# Patient Record
Sex: Female | Born: 1955 | Race: Black or African American | Hispanic: No | Marital: Single
Health system: Southern US, Community
[De-identification: ages and names within clinical notes are randomized; demographics above are authoritative.]

---

## 2003-10-09 ENCOUNTER — Other Ambulatory Visit: Payer: Self-pay

## 2004-07-15 ENCOUNTER — Emergency Department: Payer: Self-pay | Admitting: Emergency Medicine

## 2004-11-15 ENCOUNTER — Emergency Department: Payer: Self-pay | Admitting: Emergency Medicine

## 2005-02-18 ENCOUNTER — Inpatient Hospital Stay: Payer: Self-pay | Admitting: Internal Medicine

## 2005-02-18 ENCOUNTER — Other Ambulatory Visit: Payer: Self-pay

## 2005-03-19 ENCOUNTER — Emergency Department: Payer: Self-pay | Admitting: Internal Medicine

## 2005-03-27 ENCOUNTER — Other Ambulatory Visit: Payer: Self-pay

## 2005-03-27 ENCOUNTER — Emergency Department: Payer: Self-pay | Admitting: Emergency Medicine

## 2005-04-03 ENCOUNTER — Emergency Department: Payer: Self-pay | Admitting: Emergency Medicine

## 2005-04-10 ENCOUNTER — Emergency Department: Payer: Self-pay | Admitting: Emergency Medicine

## 2005-07-28 ENCOUNTER — Emergency Department: Payer: Self-pay | Admitting: Emergency Medicine

## 2005-08-22 ENCOUNTER — Emergency Department: Payer: Self-pay | Admitting: Unknown Physician Specialty

## 2005-08-22 ENCOUNTER — Other Ambulatory Visit: Payer: Self-pay

## 2007-04-04 ENCOUNTER — Emergency Department: Payer: Self-pay | Admitting: Emergency Medicine

## 2007-04-08 ENCOUNTER — Emergency Department: Payer: Self-pay | Admitting: Unknown Physician Specialty

## 2007-05-07 ENCOUNTER — Inpatient Hospital Stay: Payer: Self-pay | Admitting: Internal Medicine

## 2007-05-19 ENCOUNTER — Emergency Department: Payer: Self-pay | Admitting: Internal Medicine

## 2007-06-20 IMAGING — CR DG CHEST 2V
1 series · 2 of 2 positions shown · non-contrast
Comparison: none

REASON FOR EXAM: Shortness of breath, wheezing
COMMENTS:

[Series 1: view not recorded · 0.17mm/px · 2 of 2 slices shown]
[im 1/2]
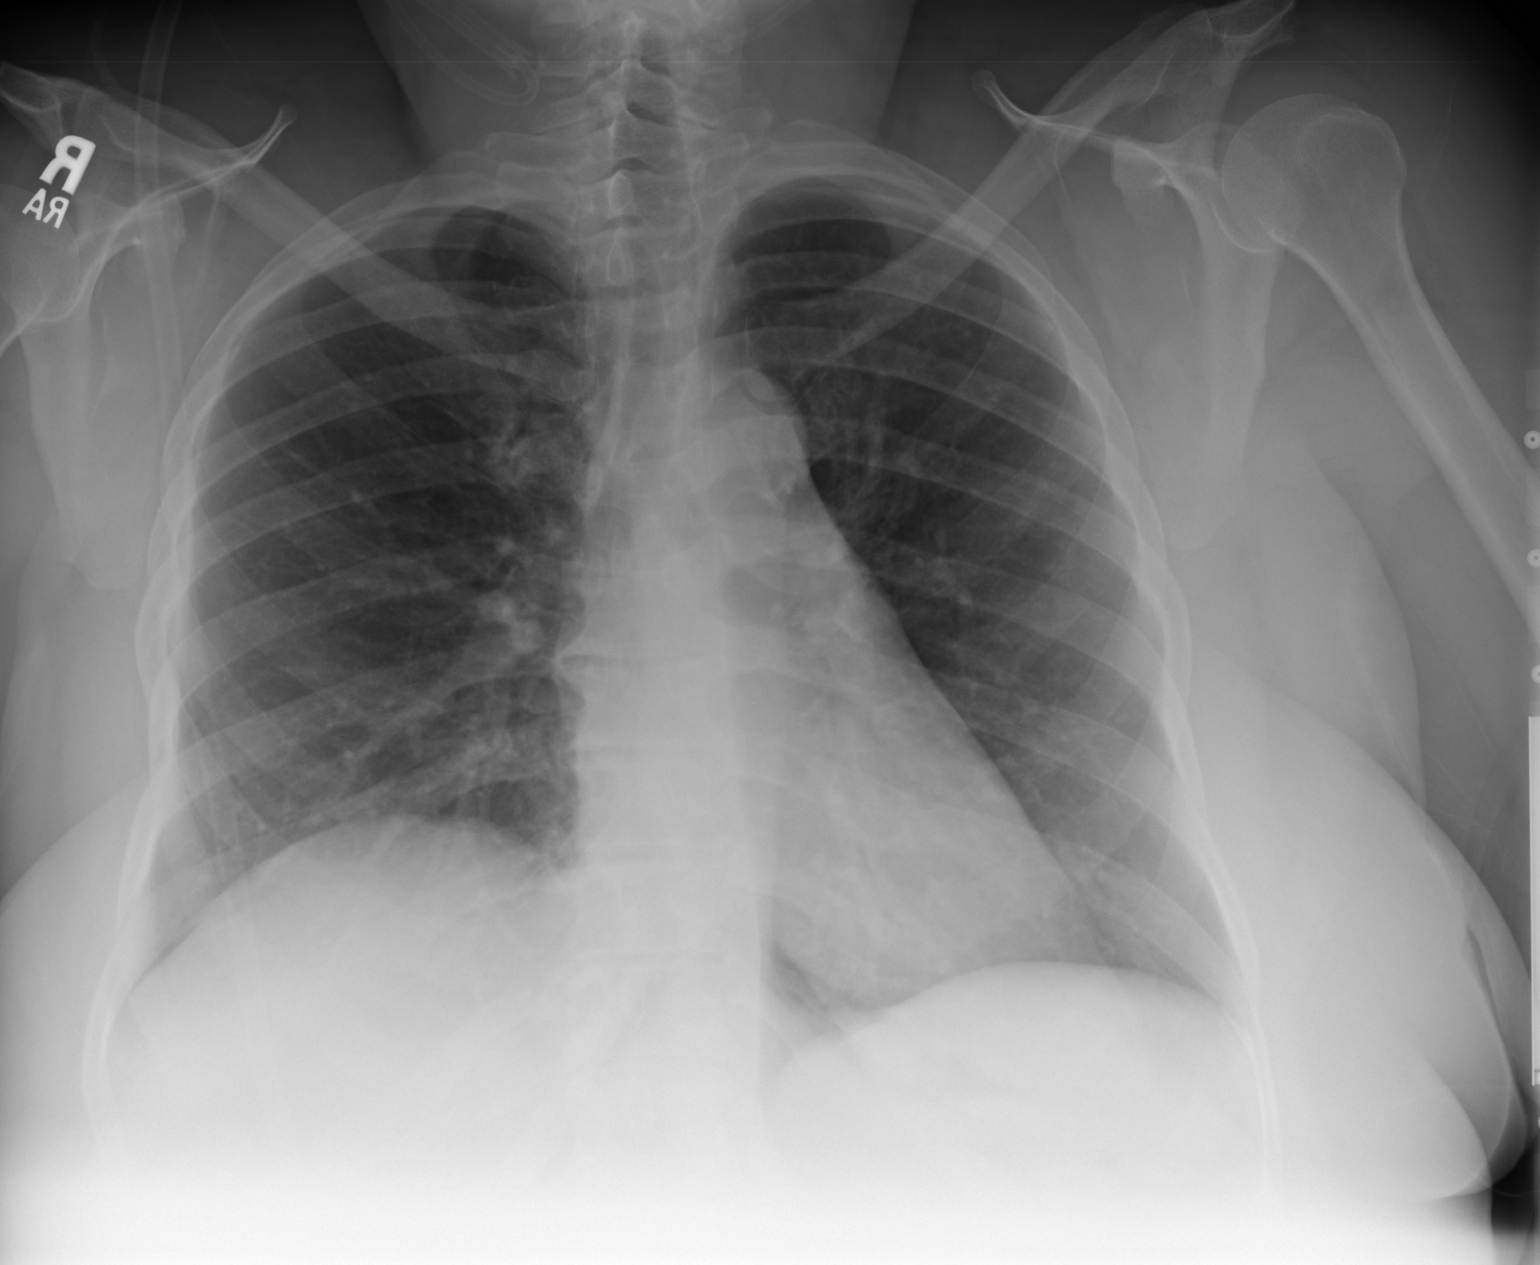
[im 2/2]
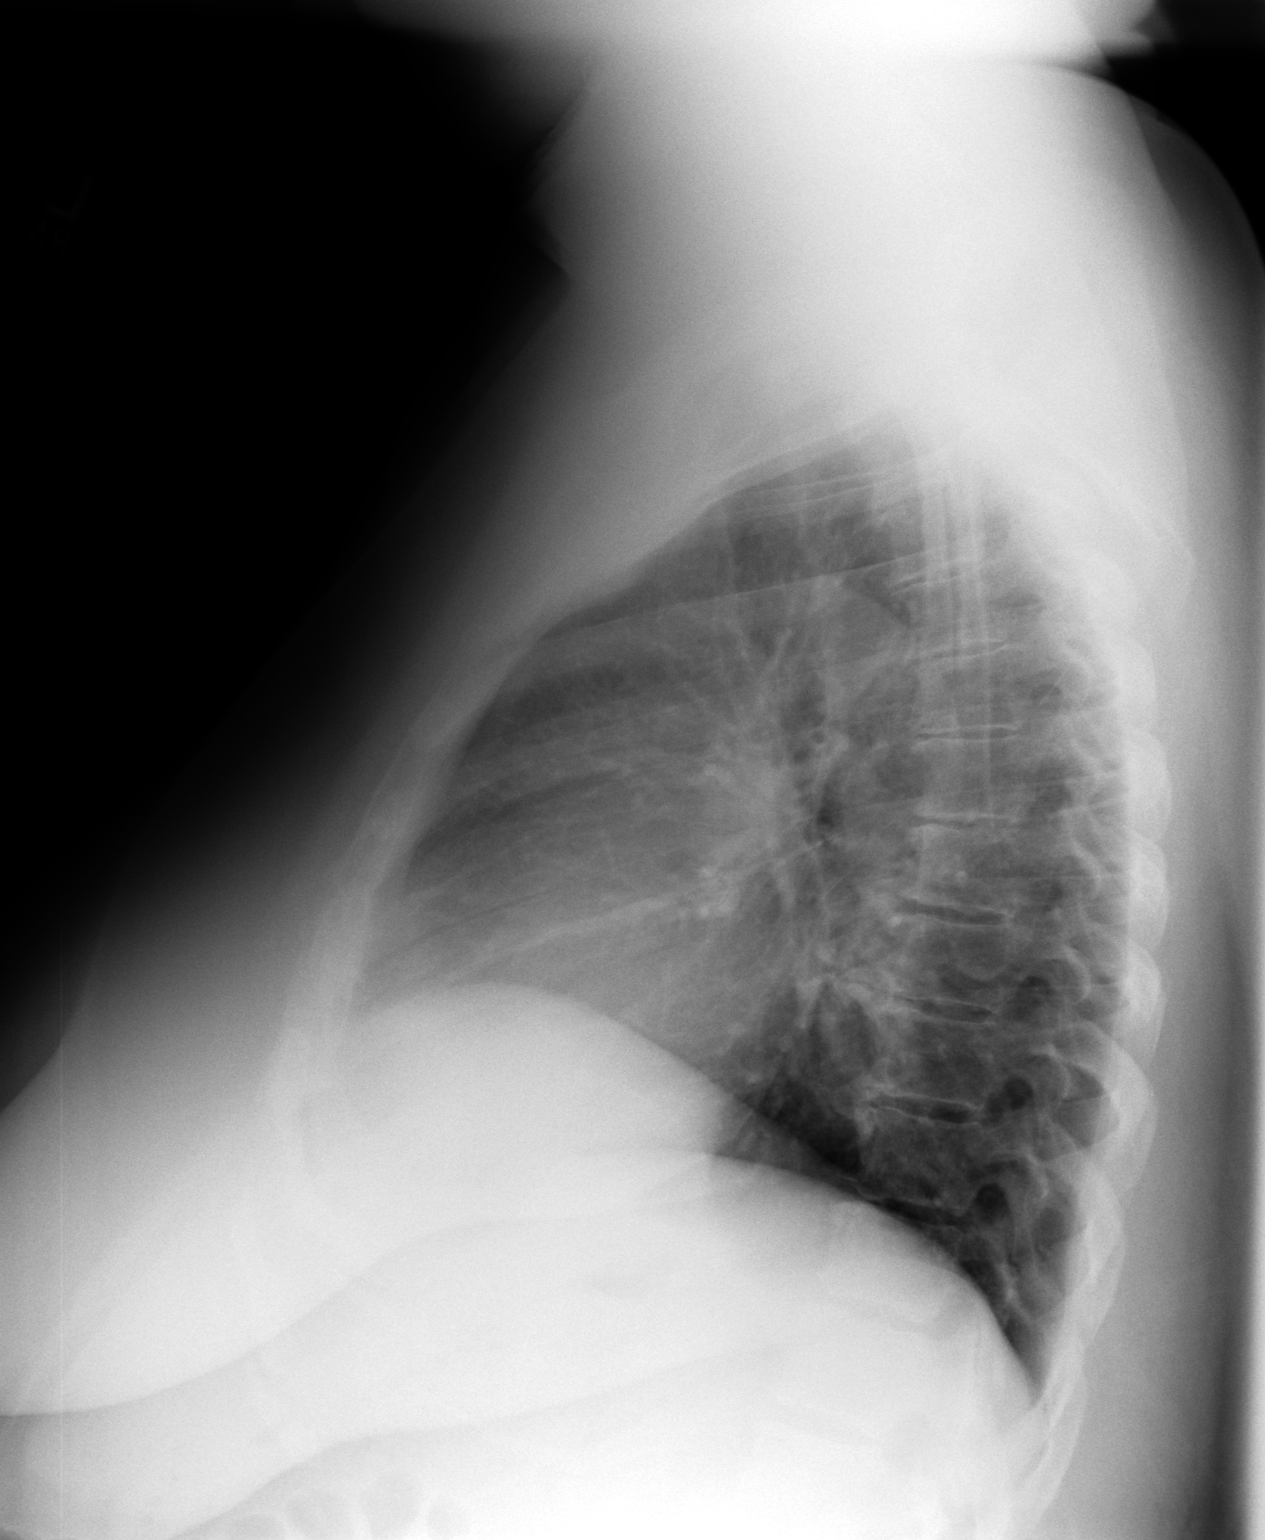

[2 of 2 positions shown; findings below may reference images not displayed]

PROCEDURE:     DXR - DXR CHEST PA (OR AP) AND LATERAL  - February 18, 2005  [DATE]

RESULT:     The lungs are adequately inflated. There is no focal infiltrate.
The heart and pulmonary vascularity are normal in appearance. There is no
pleural effusion. Mild degenerative change of the thoracic spine is seen.
IMPRESSION: I do not see evidence of an acute abnormality within the
chest. Specifically, I do not see findings to suggest pneumonia. Given that
there is clinical history of wheezing, reactive airway disease may be
present.

## 2010-02-14 ENCOUNTER — Observation Stay: Payer: Self-pay | Admitting: Emergency Medicine

## 2010-12-12 ENCOUNTER — Emergency Department: Payer: Self-pay | Admitting: Emergency Medicine

## 2011-03-08 ENCOUNTER — Emergency Department: Payer: Self-pay | Admitting: Emergency Medicine

## 2011-03-10 ENCOUNTER — Emergency Department: Payer: Self-pay | Admitting: Emergency Medicine

## 2011-04-02 ENCOUNTER — Emergency Department: Payer: Self-pay | Admitting: Emergency Medicine

## 2011-09-08 ENCOUNTER — Inpatient Hospital Stay: Payer: Self-pay | Admitting: Psychiatry

## 2011-09-08 LAB — ETHANOL
Ethanol %: <0.003 % (ref 0.000–0.080)
Ethanol: <3 mg/dL

## 2011-09-08 LAB — COMPREHENSIVE METABOLIC PANEL
Albumin: 4 g/dL (ref 3.4–5.0)
Alkaline Phosphatase: 87 U/L (ref 50–136)
Anion Gap: 9 (ref 7–16)
BUN: 21 mg/dL — ABNORMAL HIGH (ref 7–18)
Bilirubin,Total: 0.2 mg/dL (ref 0.2–1.0)
Calcium, Total: 8.9 mg/dL (ref 8.5–10.1)
Chloride: 106 mmol/L (ref 98–107)
Co2: 25 mmol/L (ref 21–32)
Creatinine: 1.35 mg/dL — ABNORMAL HIGH (ref 0.60–1.30)
EGFR (African American): 50 — ABNORMAL LOW
EGFR (Non-African Amer.): 43 — ABNORMAL LOW
Glucose: 83 mg/dL (ref 65–99)
Potassium: 4.3 mmol/L (ref 3.5–5.1)
SGOT(AST): 23 U/L (ref 15–37)

## 2011-09-08 LAB — TSH: Thyroid Stimulating Horm: 0.86 u[IU]/mL

## 2011-09-08 LAB — DRUG SCREEN, URINE
Benzodiazepine, Ur Scrn: NEGATIVE (ref ?–200)
MDMA (Ecstasy)Ur Screen: NEGATIVE (ref ?–500)
Methadone, Ur Screen: NEGATIVE (ref ?–300)
Phencyclidine (PCP) Ur S: NEGATIVE (ref ?–25)
Tricyclic, Ur Screen: NEGATIVE (ref ?–1000)

## 2011-09-08 LAB — CBC
HCT: 30.1 % — ABNORMAL LOW (ref 35.0–47.0)
HGB: 10 g/dL — ABNORMAL LOW (ref 12.0–16.0)
MCV: 95 fL (ref 80–100)
RBC: 3.18 10*6/uL — ABNORMAL LOW (ref 3.80–5.20)
RDW: 14.6 % — ABNORMAL HIGH (ref 11.5–14.5)

## 2011-09-08 LAB — URINALYSIS, COMPLETE
Bilirubin,UR: NEGATIVE
Ketone: NEGATIVE
Nitrite: NEGATIVE
RBC,UR: 2 /HPF (ref 0–5)

## 2011-09-08 LAB — LIPASE, BLOOD: Lipase: 371 U/L (ref 73–393)

## 2011-09-09 LAB — BEHAVIORAL MEDICINE 1 PANEL
Albumin: 3.6 g/dL (ref 3.4–5.0)
Alkaline Phosphatase: 77 U/L (ref 50–136)
BUN: 19 mg/dL — ABNORMAL HIGH (ref 7–18)
Basophil #: 0 10*3/uL (ref 0.0–0.1)
Bilirubin,Total: 0.1 mg/dL — ABNORMAL LOW (ref 0.2–1.0)
Calcium, Total: 8.5 mg/dL (ref 8.5–10.1)
Co2: 25 mmol/L (ref 21–32)
EGFR (African American): >60
Eosinophil #: 0.3 10*3/uL (ref 0.0–0.7)
Eosinophil %: 5.5 %
HGB: 9.5 g/dL — ABNORMAL LOW (ref 12.0–16.0)
Lymphocyte %: 37.9 %
MCH: 30.4 pg (ref 26.0–34.0)
MCHC: 32.5 g/dL (ref 32.0–36.0)
MCV: 94 fL (ref 80–100)
Monocyte %: 7.2 %
Neutrophil %: 48.8 %
Osmolality: 279 (ref 275–301)
Platelet: 285 10*3/uL (ref 150–440)
Potassium: 4.4 mmol/L (ref 3.5–5.1)
RDW: 14.5 % (ref 11.5–14.5)
SGOT(AST): 18 U/L (ref 15–37)
Sodium: 139 mmol/L (ref 136–145)
Thyroid Stimulating Horm: 0.823 u[IU]/mL
WBC: 6.2 10*3/uL (ref 3.6–11.0)

## 2011-09-09 LAB — BASIC METABOLIC PANEL
Anion Gap: 8 (ref 7–16)
BUN: 21 mg/dL — ABNORMAL HIGH (ref 7–18)
Calcium, Total: 8.7 mg/dL (ref 8.5–10.1)
Chloride: 105 mmol/L (ref 98–107)
Co2: 27 mmol/L (ref 21–32)
EGFR (African American): 59 — ABNORMAL LOW
EGFR (Non-African Amer.): 51 — ABNORMAL LOW
Glucose: 72 mg/dL (ref 65–99)
Osmolality: 281 (ref 275–301)
Sodium: 140 mmol/L (ref 136–145)

## 2011-09-09 LAB — LIPASE, BLOOD: Lipase: 497 U/L — ABNORMAL HIGH (ref 73–393)

## 2011-09-10 LAB — CBC WITH DIFFERENTIAL/PLATELET
Basophil %: 0.7 %
Eosinophil #: 0.3 10*3/uL (ref 0.0–0.7)
HCT: 28.8 % — ABNORMAL LOW (ref 35.0–47.0)
HGB: 9.5 g/dL — ABNORMAL LOW (ref 12.0–16.0)
Lymphocyte #: 1.8 10*3/uL (ref 1.0–3.6)
Lymphocyte %: 35.6 %
MCHC: 33.2 g/dL (ref 32.0–36.0)
MCV: 94 fL (ref 80–100)
Monocyte %: 6.9 %
Neutrophil #: 2.6 10*3/uL (ref 1.4–6.5)
Neutrophil %: 51.6 %
RDW: 14.4 % (ref 11.5–14.5)

## 2011-09-10 LAB — IRON AND TIBC: Unbound Iron-Bind.Cap.: 260 ug/dL

## 2011-11-11 ENCOUNTER — Emergency Department: Payer: Self-pay | Admitting: Emergency Medicine

## 2011-11-11 LAB — COMPREHENSIVE METABOLIC PANEL
Alkaline Phosphatase: 105 U/L (ref 50–136)
BUN: 26 mg/dL — ABNORMAL HIGH (ref 7–18)
Bilirubin,Total: 0.2 mg/dL (ref 0.2–1.0)
Chloride: 105 mmol/L (ref 98–107)
EGFR (African American): 47 — ABNORMAL LOW
EGFR (Non-African Amer.): 41 — ABNORMAL LOW
Glucose: 90 mg/dL (ref 65–99)
SGOT(AST): 18 U/L (ref 15–37)
SGPT (ALT): 17 U/L
Sodium: 137 mmol/L (ref 136–145)
Total Protein: 8.4 g/dL — ABNORMAL HIGH (ref 6.4–8.2)

## 2011-11-11 LAB — URINALYSIS, COMPLETE
Glucose,UR: NEGATIVE mg/dL (ref 0–75)
Hyaline Cast: 6
Ketone: NEGATIVE
Nitrite: NEGATIVE
Ph: 5 (ref 4.5–8.0)
Squamous Epithelial: 3

## 2011-11-11 LAB — CBC
HCT: 32.8 % — ABNORMAL LOW (ref 35.0–47.0)
HGB: 10.9 g/dL — ABNORMAL LOW (ref 12.0–16.0)
MCHC: 33.1 g/dL (ref 32.0–36.0)
MCV: 93 fL (ref 80–100)
Platelet: 258 10*3/uL (ref 150–440)
RBC: 3.53 10*6/uL — ABNORMAL LOW (ref 3.80–5.20)
RDW: 15.1 % — ABNORMAL HIGH (ref 11.5–14.5)
WBC: 8.2 10*3/uL (ref 3.6–11.0)

## 2011-12-01 ENCOUNTER — Inpatient Hospital Stay: Payer: Self-pay | Admitting: Internal Medicine

## 2011-12-01 LAB — CBC
HCT: 32.8 % — ABNORMAL LOW (ref 35.0–47.0)
HGB: 10.9 g/dL — ABNORMAL LOW (ref 12.0–16.0)
MCH: 30.9 pg (ref 26.0–34.0)
RBC: 3.53 10*6/uL — ABNORMAL LOW (ref 3.80–5.20)
RDW: 14.6 % — ABNORMAL HIGH (ref 11.5–14.5)
WBC: 6.6 10*3/uL (ref 3.6–11.0)

## 2011-12-01 LAB — COMPREHENSIVE METABOLIC PANEL
Anion Gap: 4 — ABNORMAL LOW (ref 7–16)
BUN: 14 mg/dL (ref 7–18)
Bilirubin,Total: 0.2 mg/dL (ref 0.2–1.0)
Chloride: 104 mmol/L (ref 98–107)
Co2: 31 mmol/L (ref 21–32)
Creatinine: 1.05 mg/dL (ref 0.60–1.30)
EGFR (African American): >60
Potassium: 4.3 mmol/L (ref 3.5–5.1)
SGOT(AST): 23 U/L (ref 15–37)
Sodium: 139 mmol/L (ref 136–145)
Total Protein: 8.2 g/dL (ref 6.4–8.2)

## 2011-12-01 LAB — URINALYSIS, COMPLETE
Bacteria: NONE SEEN
Bilirubin,UR: NEGATIVE
Blood: NEGATIVE
Glucose,UR: NEGATIVE mg/dL (ref 0–75)
Ketone: NEGATIVE
Leukocyte Esterase: NEGATIVE
Nitrite: NEGATIVE
Ph: 7 (ref 4.5–8.0)
Squamous Epithelial: 2

## 2011-12-01 LAB — CBC WITH DIFFERENTIAL/PLATELET
Basophil #: 0 10*3/uL (ref 0.0–0.1)
Basophil %: 0.5 %
Eosinophil #: 0.3 10*3/uL (ref 0.0–0.7)
Eosinophil %: 4.1 %
HGB: 10.9 g/dL — ABNORMAL LOW (ref 12.0–16.0)
Lymphocyte %: 32.4 %
MCV: 93 fL (ref 80–100)
Monocyte %: 6.6 %
Neutrophil %: 56.4 %
Platelet: 286 10*3/uL (ref 150–440)
RBC: 3.49 10*6/uL — ABNORMAL LOW (ref 3.80–5.20)
RDW: 14.5 % (ref 11.5–14.5)
WBC: 6.7 10*3/uL (ref 3.6–11.0)

## 2011-12-01 LAB — CK TOTAL AND CKMB (NOT AT ARMC)
CK, Total: 94 U/L (ref 21–215)
CK-MB: <0.5 ng/mL — ABNORMAL LOW (ref 0.5–3.6)

## 2011-12-01 LAB — APTT: Activated PTT: 36 secs — ABNORMAL HIGH (ref 23.6–35.9)

## 2011-12-02 LAB — CBC WITH DIFFERENTIAL/PLATELET
Basophil #: 0 10*3/uL (ref 0.0–0.1)
Basophil %: 0.4 %
Eosinophil #: 0.3 10*3/uL (ref 0.0–0.7)
Eosinophil %: 4.6 %
HCT: 30.5 % — ABNORMAL LOW (ref 35.0–47.0)
HGB: 10.4 g/dL — ABNORMAL LOW (ref 12.0–16.0)
Lymphocyte #: 2.5 10*3/uL (ref 1.0–3.6)
MCH: 31.3 pg (ref 26.0–34.0)
MCHC: 34 g/dL (ref 32.0–36.0)
MCV: 92 fL (ref 80–100)
Monocyte #: 0.4 x10 3/mm (ref 0.2–0.9)
Neutrophil #: 3.4 10*3/uL (ref 1.4–6.5)
Neutrophil %: 51.2 %
RBC: 3.31 10*6/uL — ABNORMAL LOW (ref 3.80–5.20)
RDW: 14.6 % — ABNORMAL HIGH (ref 11.5–14.5)

## 2011-12-02 LAB — TROPONIN I: Troponin-I: <0.02 ng/mL

## 2011-12-02 LAB — COMPREHENSIVE METABOLIC PANEL
Albumin: 3.6 g/dL (ref 3.4–5.0)
Alkaline Phosphatase: 111 U/L (ref 50–136)
Bilirubin,Total: 0.2 mg/dL (ref 0.2–1.0)
Co2: 29 mmol/L (ref 21–32)
Creatinine: 0.99 mg/dL (ref 0.60–1.30)
Glucose: 97 mg/dL (ref 65–99)
Osmolality: 280 (ref 275–301)
SGPT (ALT): 16 U/L
Sodium: 140 mmol/L (ref 136–145)
Total Protein: 7.3 g/dL (ref 6.4–8.2)

## 2011-12-02 LAB — HCG, QUANTITATIVE, PREGNANCY: Beta Hcg, Quant.: <1 m[IU]/mL — ABNORMAL LOW

## 2011-12-03 LAB — CK TOTAL AND CKMB (NOT AT ARMC)
CK, Total: 79 U/L (ref 21–215)
CK-MB: <0.5 ng/mL — ABNORMAL LOW (ref 0.5–3.6)

## 2012-01-12 ENCOUNTER — Ambulatory Visit: Payer: Self-pay

## 2012-02-16 ENCOUNTER — Ambulatory Visit: Payer: Self-pay | Admitting: Radiation Oncology

## 2012-02-22 LAB — CREATININE, SERUM
Creatinine: 1.33 mg/dL — ABNORMAL HIGH (ref 0.60–1.30)
EGFR (Non-African Amer.): 44 — ABNORMAL LOW

## 2012-02-23 ENCOUNTER — Ambulatory Visit: Payer: Self-pay | Admitting: Radiation Oncology

## 2012-02-24 ENCOUNTER — Ambulatory Visit: Payer: Self-pay | Admitting: Surgery

## 2012-02-24 LAB — DRUG SCREEN, URINE
Amphetamines, Ur Screen: NEGATIVE (ref ?–1000)
Benzodiazepine, Ur Scrn: POSITIVE (ref ?–200)
Cannabinoid 50 Ng, Ur ~~LOC~~: NEGATIVE (ref ?–50)
Cocaine Metabolite,Ur ~~LOC~~: POSITIVE (ref ?–300)
MDMA (Ecstasy)Ur Screen: POSITIVE (ref ?–500)
Methadone, Ur Screen: NEGATIVE (ref ?–300)
Phencyclidine (PCP) Ur S: NEGATIVE (ref ?–25)

## 2012-02-25 LAB — COMPREHENSIVE METABOLIC PANEL
Alkaline Phosphatase: 74 U/L (ref 50–136)
BUN: 25 mg/dL — ABNORMAL HIGH (ref 7–18)
Bilirubin,Total: 0.1 mg/dL — ABNORMAL LOW (ref 0.2–1.0)
Calcium, Total: 5.9 mg/dL — CL (ref 8.5–10.1)
Chloride: 114 mmol/L — ABNORMAL HIGH (ref 98–107)
Co2: 16 mmol/L — ABNORMAL LOW (ref 21–32)
Creatinine: 0.85 mg/dL (ref 0.60–1.30)
EGFR (African American): >60
EGFR (Non-African Amer.): >60
SGPT (ALT): 39 U/L (ref 12–78)
Total Protein: 4.6 g/dL — ABNORMAL LOW (ref 6.4–8.2)

## 2012-02-25 LAB — CBC CANCER CENTER
Basophil #: 0 x10 3/mm (ref 0.0–0.1)
Eosinophil %: 0 %
Lymphocyte #: 1.7 x10 3/mm (ref 1.0–3.6)
Lymphocyte %: 17.5 %
MCH: 30.4 pg (ref 26.0–34.0)
MCV: 91 fL (ref 80–100)
Monocyte #: 0.6 x10 3/mm (ref 0.2–0.9)
Monocyte %: 6.5 %
Neutrophil %: 75.8 %
Platelet: 265 x10 3/mm (ref 150–440)
RBC: 2.72 10*6/uL — ABNORMAL LOW (ref 3.80–5.20)
RDW: 13.1 % (ref 11.5–14.5)
WBC: 9.7 x10 3/mm (ref 3.6–11.0)

## 2012-02-29 ENCOUNTER — Emergency Department: Payer: Self-pay | Admitting: Emergency Medicine

## 2012-02-29 LAB — CBC WITH DIFFERENTIAL/PLATELET
Basophil #: 0 10*3/uL (ref 0.0–0.1)
Basophil %: 0.1 %
Eosinophil #: 0.1 10*3/uL (ref 0.0–0.7)
Eosinophil %: 0.1 %
HGB: 9.6 g/dL — ABNORMAL LOW (ref 12.0–16.0)
Lymphocyte %: 3.2 %
MCH: 28.6 pg (ref 26.0–34.0)
MCHC: 32.2 g/dL (ref 32.0–36.0)
Monocyte %: 0.7 %
Neutrophil %: 95.9 %
Platelet: 284 10*3/uL (ref 150–440)
RDW: 13.3 % (ref 11.5–14.5)
WBC: 28.7 10*3/uL — ABNORMAL HIGH (ref 3.6–11.0)

## 2012-02-29 LAB — COMPREHENSIVE METABOLIC PANEL
Alkaline Phosphatase: 199 U/L — ABNORMAL HIGH (ref 50–136)
Anion Gap: 10 (ref 7–16)
BUN: 14 mg/dL (ref 7–18)
Bilirubin,Total: 0.4 mg/dL (ref 0.2–1.0)
Chloride: 104 mmol/L (ref 98–107)
Creatinine: 0.98 mg/dL (ref 0.60–1.30)
EGFR (African American): >60
EGFR (Non-African Amer.): >60
Osmolality: 274 (ref 275–301)
SGOT(AST): 42 U/L — ABNORMAL HIGH (ref 15–37)
SGPT (ALT): 55 U/L (ref 12–78)
Sodium: 137 mmol/L (ref 136–145)
Total Protein: 7.2 g/dL (ref 6.4–8.2)

## 2012-02-29 LAB — TROPONIN I: Troponin-I: <0.02 ng/mL

## 2012-03-03 LAB — CBC CANCER CENTER
Basophil %: 0.6 %
Eosinophil %: 3.6 %
Lymphocyte %: 17.9 %
MCH: 29.9 pg (ref 26.0–34.0)
MCHC: 32.6 g/dL (ref 32.0–36.0)
MCV: 92 fL (ref 80–100)
Neutrophil #: 1.8 x10 3/mm (ref 1.4–6.5)
Platelet: 190 x10 3/mm (ref 150–440)
RBC: 3 10*6/uL — ABNORMAL LOW (ref 3.80–5.20)

## 2012-03-06 LAB — CULTURE, BLOOD (SINGLE)

## 2012-03-10 LAB — CBC CANCER CENTER
Basophil #: 0 x10 3/mm (ref 0.0–0.1)
Eosinophil #: 0.1 x10 3/mm (ref 0.0–0.7)
HCT: 27 % — ABNORMAL LOW (ref 35.0–47.0)
Lymphocyte %: 16 %
MCHC: 32.4 g/dL (ref 32.0–36.0)
Neutrophil #: 6.6 x10 3/mm — ABNORMAL HIGH (ref 1.4–6.5)
RBC: 2.94 10*6/uL — ABNORMAL LOW (ref 3.80–5.20)
RDW: 13.1 % (ref 11.5–14.5)
WBC: 8.3 x10 3/mm (ref 3.6–11.0)

## 2012-03-10 LAB — COMPREHENSIVE METABOLIC PANEL
Anion Gap: 13 (ref 7–16)
BUN: 20 mg/dL — ABNORMAL HIGH (ref 7–18)
Calcium, Total: 9 mg/dL (ref 8.5–10.1)
Co2: 25 mmol/L (ref 21–32)
EGFR (African American): >60
EGFR (Non-African Amer.): 53 — ABNORMAL LOW
Glucose: 109 mg/dL — ABNORMAL HIGH (ref 65–99)
Osmolality: 286 (ref 275–301)
Potassium: 4 mmol/L (ref 3.5–5.1)
Sodium: 142 mmol/L (ref 136–145)
Total Protein: 6.9 g/dL (ref 6.4–8.2)

## 2012-03-24 LAB — COMPREHENSIVE METABOLIC PANEL
Alkaline Phosphatase: 107 U/L (ref 50–136)
Anion Gap: 11 (ref 7–16)
Bilirubin,Total: 0.1 mg/dL — ABNORMAL LOW (ref 0.2–1.0)
Calcium, Total: 8.9 mg/dL (ref 8.5–10.1)
Chloride: 101 mmol/L (ref 98–107)
Co2: 27 mmol/L (ref 21–32)
Creatinine: 1.24 mg/dL (ref 0.60–1.30)
EGFR (African American): 56 — ABNORMAL LOW
EGFR (Non-African Amer.): 48 — ABNORMAL LOW
Osmolality: 281 (ref 275–301)
SGOT(AST): 15 U/L (ref 15–37)
Sodium: 139 mmol/L (ref 136–145)

## 2012-03-24 LAB — CBC CANCER CENTER
Basophil #: 0.1 x10 3/mm (ref 0.0–0.1)
Eosinophil %: 0.7 %
Lymphocyte #: 1.1 x10 3/mm (ref 1.0–3.6)
Lymphocyte %: 8 %
MCH: 29.9 pg (ref 26.0–34.0)
MCV: 91 fL (ref 80–100)
Monocyte #: 0.7 x10 3/mm (ref 0.2–0.9)
Monocyte %: 5.3 %
Neutrophil %: 85.3 %
Platelet: 188 x10 3/mm (ref 150–440)
RDW: 13.5 % (ref 11.5–14.5)
WBC: 13.5 x10 3/mm — ABNORMAL HIGH (ref 3.6–11.0)

## 2012-03-25 ENCOUNTER — Ambulatory Visit: Payer: Self-pay | Admitting: Radiation Oncology

## 2012-03-31 LAB — CBC CANCER CENTER
Eosinophil %: 2.6 %
HCT: 23.4 % — ABNORMAL LOW (ref 35.0–47.0)
Lymphocyte #: 0.5 x10 3/mm — ABNORMAL LOW (ref 1.0–3.6)
Lymphocyte %: 16.9 %
MCV: 90 fL (ref 80–100)
Monocyte #: 0.1 x10 3/mm — ABNORMAL LOW (ref 0.2–0.9)
Monocyte %: 4 %
Neutrophil #: 2.1 x10 3/mm (ref 1.4–6.5)
Platelet: 203 x10 3/mm (ref 150–440)
RBC: 2.6 10*6/uL — ABNORMAL LOW (ref 3.80–5.20)
WBC: 2.8 x10 3/mm — ABNORMAL LOW (ref 3.6–11.0)

## 2012-04-07 LAB — CBC CANCER CENTER
Basophil #: 0 x10 3/mm (ref 0.0–0.1)
Basophil %: 0.5 %
Eosinophil #: 0.1 x10 3/mm (ref 0.0–0.7)
HCT: 25 % — ABNORMAL LOW (ref 35.0–47.0)
HGB: 8.2 g/dL — ABNORMAL LOW (ref 12.0–16.0)
Lymphocyte %: 8.2 %
MCH: 30.3 pg (ref 26.0–34.0)
MCHC: 32.8 g/dL (ref 32.0–36.0)
Monocyte #: 0.5 x10 3/mm (ref 0.2–0.9)
Neutrophil %: 85.1 %
Platelet: 341 x10 3/mm (ref 150–440)
RBC: 2.71 10*6/uL — ABNORMAL LOW (ref 3.80–5.20)

## 2012-04-07 LAB — COMPREHENSIVE METABOLIC PANEL
Albumin: 3.6 g/dL (ref 3.4–5.0)
Anion Gap: 14 (ref 7–16)
BUN: 26 mg/dL — ABNORMAL HIGH (ref 7–18)
Calcium, Total: 8.7 mg/dL (ref 8.5–10.1)
Chloride: 109 mmol/L — ABNORMAL HIGH (ref 98–107)
EGFR (African American): 41 — ABNORMAL LOW
Glucose: 105 mg/dL — ABNORMAL HIGH (ref 65–99)
Osmolality: 283 (ref 275–301)
Potassium: 4.4 mmol/L (ref 3.5–5.1)
Sodium: 139 mmol/L (ref 136–145)
Total Protein: 6.9 g/dL (ref 6.4–8.2)

## 2012-04-24 ENCOUNTER — Ambulatory Visit: Payer: Self-pay | Admitting: Radiation Oncology

## 2012-04-28 LAB — COMPREHENSIVE METABOLIC PANEL
Albumin: 3.2 g/dL — ABNORMAL LOW (ref 3.4–5.0)
Anion Gap: 12 (ref 7–16)
BUN: 15 mg/dL (ref 7–18)
Bilirubin,Total: <0.1 mg/dL — ABNORMAL LOW (ref 0.2–1.0)
Calcium, Total: 8.6 mg/dL (ref 8.5–10.1)
Creatinine: 0.97 mg/dL (ref 0.60–1.30)
EGFR (African American): >60
EGFR (Non-African Amer.): >60
Glucose: 116 mg/dL — ABNORMAL HIGH (ref 65–99)
Osmolality: 281 (ref 275–301)
Potassium: 3.5 mmol/L (ref 3.5–5.1)
SGPT (ALT): 17 U/L (ref 12–78)
Sodium: 140 mmol/L (ref 136–145)
Total Protein: 6.3 g/dL — ABNORMAL LOW (ref 6.4–8.2)

## 2012-04-28 LAB — CBC CANCER CENTER
Basophil #: 0 x10 3/mm (ref 0.0–0.1)
Basophil %: 0.6 %
Eosinophil #: 0.1 x10 3/mm (ref 0.0–0.7)
HCT: 20.3 % — ABNORMAL LOW (ref 35.0–47.0)
HGB: 7 g/dL — ABNORMAL LOW (ref 12.0–16.0)
Lymphocyte #: 0.5 x10 3/mm — ABNORMAL LOW (ref 1.0–3.6)
MCHC: 34.4 g/dL (ref 32.0–36.0)
MCV: 90 fL (ref 80–100)
Monocyte %: 7.8 %
Platelet: 336 x10 3/mm (ref 150–440)
RDW: 18.3 % — ABNORMAL HIGH (ref 11.5–14.5)
WBC: 5.4 x10 3/mm (ref 3.6–11.0)

## 2012-05-05 LAB — CBC CANCER CENTER
Basophil #: 0.1 x10 3/mm (ref 0.0–0.1)
Eosinophil #: 0.2 x10 3/mm (ref 0.0–0.7)
Eosinophil %: 5.1 %
HGB: 8.6 g/dL — ABNORMAL LOW (ref 12.0–16.0)
Lymphocyte #: 0.5 x10 3/mm — ABNORMAL LOW (ref 1.0–3.6)
MCH: 31.8 pg (ref 26.0–34.0)
MCHC: 34.8 g/dL (ref 32.0–36.0)
MCV: 91 fL (ref 80–100)
Monocyte #: 0.3 x10 3/mm (ref 0.2–0.9)
Neutrophil #: 3.1 x10 3/mm (ref 1.4–6.5)
Platelet: 225 x10 3/mm (ref 150–440)
RBC: 2.7 10*6/uL — ABNORMAL LOW (ref 3.80–5.20)
RDW: 18.5 % — ABNORMAL HIGH (ref 11.5–14.5)
WBC: 4.1 x10 3/mm (ref 3.6–11.0)

## 2012-05-05 LAB — COMPREHENSIVE METABOLIC PANEL
Albumin: 3.2 g/dL — ABNORMAL LOW (ref 3.4–5.0)
Alkaline Phosphatase: 79 U/L (ref 50–136)
Anion Gap: 7 (ref 7–16)
BUN: 13 mg/dL (ref 7–18)
Bilirubin,Total: 0.1 mg/dL — ABNORMAL LOW (ref 0.2–1.0)
Calcium, Total: 9.1 mg/dL (ref 8.5–10.1)
EGFR (African American): >60
EGFR (Non-African Amer.): 55 — ABNORMAL LOW
Glucose: 107 mg/dL — ABNORMAL HIGH (ref 65–99)
SGOT(AST): 12 U/L — ABNORMAL LOW (ref 15–37)
SGPT (ALT): 18 U/L (ref 12–78)
Total Protein: 6.6 g/dL (ref 6.4–8.2)

## 2012-05-25 ENCOUNTER — Ambulatory Visit: Payer: Self-pay | Admitting: Radiation Oncology

## 2012-05-27 LAB — COMPREHENSIVE METABOLIC PANEL
Albumin: 3.5 g/dL (ref 3.4–5.0)
Alkaline Phosphatase: 125 U/L (ref 50–136)
Anion Gap: 8 (ref 7–16)
BUN: 14 mg/dL (ref 7–18)
Chloride: 105 mmol/L (ref 98–107)
Co2: 26 mmol/L (ref 21–32)
EGFR (Non-African Amer.): >60
Osmolality: 277 (ref 275–301)
Potassium: 3.8 mmol/L (ref 3.5–5.1)
Sodium: 139 mmol/L (ref 136–145)
Total Protein: 7 g/dL (ref 6.4–8.2)

## 2012-05-27 LAB — CBC CANCER CENTER
Basophil #: 0 x10 3/mm (ref 0.0–0.1)
Basophil %: 0.9 %
Eosinophil %: 2.4 %
Lymphocyte #: 0.7 x10 3/mm — ABNORMAL LOW (ref 1.0–3.6)
Lymphocyte %: 14.1 %
MCH: 31.3 pg (ref 26.0–34.0)
MCHC: 34.3 g/dL (ref 32.0–36.0)
Monocyte #: 0.3 x10 3/mm (ref 0.2–0.9)
Monocyte %: 5.9 %
Neutrophil #: 3.8 x10 3/mm (ref 1.4–6.5)
Neutrophil %: 76.7 %
Platelet: 291 x10 3/mm (ref 150–440)
RBC: 3.05 10*6/uL — ABNORMAL LOW (ref 3.80–5.20)
RDW: 17 % — ABNORMAL HIGH (ref 11.5–14.5)
WBC: 4.9 x10 3/mm (ref 3.6–11.0)

## 2012-06-03 LAB — COMPREHENSIVE METABOLIC PANEL
Albumin: 3.6 g/dL (ref 3.4–5.0)
Alkaline Phosphatase: 119 U/L (ref 50–136)
Anion Gap: 9 (ref 7–16)
Co2: 26 mmol/L (ref 21–32)
Creatinine: 0.83 mg/dL (ref 0.60–1.30)
Glucose: 95 mg/dL (ref 65–99)
Potassium: 3.5 mmol/L (ref 3.5–5.1)
SGOT(AST): 27 U/L (ref 15–37)
SGPT (ALT): 21 U/L (ref 12–78)

## 2012-06-03 LAB — CBC CANCER CENTER
Eosinophil #: 0.2 x10 3/mm (ref 0.0–0.7)
Eosinophil %: 4.8 %
Lymphocyte #: 0.6 x10 3/mm — ABNORMAL LOW (ref 1.0–3.6)
Lymphocyte %: 17.1 %
MCHC: 34.1 g/dL (ref 32.0–36.0)
MCV: 92 fL (ref 80–100)
Monocyte #: 0.2 x10 3/mm (ref 0.2–0.9)
RBC: 2.81 10*6/uL — ABNORMAL LOW (ref 3.80–5.20)
RDW: 17 % — ABNORMAL HIGH (ref 11.5–14.5)
WBC: 3.6 x10 3/mm (ref 3.6–11.0)

## 2012-06-10 LAB — COMPREHENSIVE METABOLIC PANEL
Anion Gap: 15 (ref 7–16)
BUN: 15 mg/dL (ref 7–18)
Bilirubin,Total: 0.1 mg/dL — ABNORMAL LOW (ref 0.2–1.0)
Calcium, Total: 8.4 mg/dL — ABNORMAL LOW (ref 8.5–10.1)
Co2: 22 mmol/L (ref 21–32)
EGFR (Non-African Amer.): >60
Glucose: 112 mg/dL — ABNORMAL HIGH (ref 65–99)
SGOT(AST): 12 U/L — ABNORMAL LOW (ref 15–37)
SGPT (ALT): 15 U/L (ref 12–78)
Sodium: 142 mmol/L (ref 136–145)
Total Protein: 6.7 g/dL (ref 6.4–8.2)

## 2012-06-10 LAB — CBC CANCER CENTER
Eosinophil #: 0.1 x10 3/mm (ref 0.0–0.7)
HCT: 28.1 % — ABNORMAL LOW (ref 35.0–47.0)
HGB: 9.6 g/dL — ABNORMAL LOW (ref 12.0–16.0)
Lymphocyte #: 0.5 x10 3/mm — ABNORMAL LOW (ref 1.0–3.6)
Lymphocyte %: 17.1 %
MCHC: 34.2 g/dL (ref 32.0–36.0)
Monocyte #: 0.2 x10 3/mm (ref 0.2–0.9)
Monocyte %: 5.6 %
Neutrophil #: 2.3 x10 3/mm (ref 1.4–6.5)
Platelet: 218 x10 3/mm (ref 150–440)
RBC: 3.04 10*6/uL — ABNORMAL LOW (ref 3.80–5.20)
WBC: 3.1 x10 3/mm — ABNORMAL LOW (ref 3.6–11.0)

## 2012-06-17 LAB — CBC CANCER CENTER
Basophil #: 0 x10 3/mm (ref 0.0–0.1)
Basophil %: 0.6 %
Eosinophil %: 1.9 %
HGB: 9 g/dL — ABNORMAL LOW (ref 12.0–16.0)
Lymphocyte #: 0.6 x10 3/mm — ABNORMAL LOW (ref 1.0–3.6)
Lymphocyte %: 17.3 %
MCV: 93 fL (ref 80–100)
Monocyte #: 0.3 x10 3/mm (ref 0.2–0.9)
Neutrophil #: 2.6 x10 3/mm (ref 1.4–6.5)
WBC: 3.6 x10 3/mm (ref 3.6–11.0)

## 2012-06-17 LAB — COMPREHENSIVE METABOLIC PANEL
Albumin: 3.2 g/dL — ABNORMAL LOW (ref 3.4–5.0)
Alkaline Phosphatase: 97 U/L (ref 50–136)
Anion Gap: 8 (ref 7–16)
BUN: 22 mg/dL — ABNORMAL HIGH (ref 7–18)
Bilirubin,Total: 0.1 mg/dL — ABNORMAL LOW (ref 0.2–1.0)
Calcium, Total: 8.8 mg/dL (ref 8.5–10.1)
Chloride: 104 mmol/L (ref 98–107)
Co2: 27 mmol/L (ref 21–32)
Creatinine: 1.3 mg/dL (ref 0.60–1.30)
EGFR (African American): 53 — ABNORMAL LOW
Potassium: 3.8 mmol/L (ref 3.5–5.1)
SGOT(AST): 13 U/L — ABNORMAL LOW (ref 15–37)
SGPT (ALT): 17 U/L (ref 12–78)
Total Protein: 6.6 g/dL (ref 6.4–8.2)

## 2012-06-24 LAB — COMPREHENSIVE METABOLIC PANEL
Anion Gap: 8 (ref 7–16)
Bilirubin,Total: 0.2 mg/dL (ref 0.2–1.0)
Calcium, Total: 9 mg/dL (ref 8.5–10.1)
Chloride: 102 mmol/L (ref 98–107)
EGFR (African American): >60
EGFR (Non-African Amer.): 53 — ABNORMAL LOW
Glucose: 100 mg/dL — ABNORMAL HIGH (ref 65–99)
Potassium: 3.9 mmol/L (ref 3.5–5.1)
SGPT (ALT): 17 U/L (ref 12–78)
Total Protein: 7 g/dL (ref 6.4–8.2)

## 2012-06-24 LAB — CBC CANCER CENTER
Basophil #: 0 x10 3/mm (ref 0.0–0.1)
Basophil %: 0.7 %
Eosinophil #: 0.1 x10 3/mm (ref 0.0–0.7)
Eosinophil %: 2.4 %
HGB: 9.4 g/dL — ABNORMAL LOW (ref 12.0–16.0)
Lymphocyte #: 0.6 x10 3/mm — ABNORMAL LOW (ref 1.0–3.6)
Monocyte #: 0.2 x10 3/mm (ref 0.2–0.9)
Monocyte %: 6.7 %
Neutrophil #: 2 x10 3/mm (ref 1.4–6.5)
RBC: 2.95 10*6/uL — ABNORMAL LOW (ref 3.80–5.20)
RDW: 15.2 % — ABNORMAL HIGH (ref 11.5–14.5)

## 2012-06-25 ENCOUNTER — Ambulatory Visit: Payer: Self-pay | Admitting: Radiation Oncology

## 2012-07-18 LAB — COMPREHENSIVE METABOLIC PANEL
Albumin: 3.3 g/dL — ABNORMAL LOW (ref 3.4–5.0)
Anion Gap: 7 (ref 7–16)
BUN: 14 mg/dL (ref 7–18)
Bilirubin,Total: <0.1 mg/dL — ABNORMAL LOW (ref 0.2–1.0)
Calcium, Total: 9 mg/dL (ref 8.5–10.1)
Co2: 28 mmol/L (ref 21–32)
EGFR (African American): >60
EGFR (Non-African Amer.): 59 — ABNORMAL LOW
Glucose: 108 mg/dL — ABNORMAL HIGH (ref 65–99)
Osmolality: 277 (ref 275–301)
Sodium: 138 mmol/L (ref 136–145)
Total Protein: 6.8 g/dL (ref 6.4–8.2)

## 2012-07-18 LAB — CBC CANCER CENTER
Basophil #: 0 x10 3/mm (ref 0.0–0.1)
Basophil %: 0.9 %
Eosinophil #: 0.3 x10 3/mm (ref 0.0–0.7)
Eosinophil %: 5.3 %
HCT: 29.9 % — ABNORMAL LOW (ref 35.0–47.0)
HGB: 10.3 g/dL — ABNORMAL LOW (ref 12.0–16.0)
Lymphocyte %: 18.5 %
MCH: 31.3 pg (ref 26.0–34.0)
Monocyte #: 0.4 x10 3/mm (ref 0.2–0.9)
Monocyte %: 7.6 %
Neutrophil #: 3.2 x10 3/mm (ref 1.4–6.5)
Neutrophil %: 67.7 %
RDW: 13.8 % (ref 11.5–14.5)

## 2012-07-23 ENCOUNTER — Ambulatory Visit: Payer: Self-pay | Admitting: Radiation Oncology

## 2012-07-29 LAB — COMPREHENSIVE METABOLIC PANEL
Albumin: 3.4 g/dL (ref 3.4–5.0)
Alkaline Phosphatase: 102 U/L (ref 50–136)
Anion Gap: 12 (ref 7–16)
BUN: 18 mg/dL (ref 7–18)
Bilirubin,Total: 0.2 mg/dL (ref 0.2–1.0)
Calcium, Total: 8.8 mg/dL (ref 8.5–10.1)
Chloride: 101 mmol/L (ref 98–107)
EGFR (African American): 59 — ABNORMAL LOW
EGFR (Non-African Amer.): 51 — ABNORMAL LOW
Glucose: 89 mg/dL (ref 65–99)
Potassium: 3.8 mmol/L (ref 3.5–5.1)
SGPT (ALT): 14 U/L (ref 12–78)

## 2012-07-29 LAB — CBC CANCER CENTER
Basophil #: 0 x10 3/mm (ref 0.0–0.1)
Basophil %: 0.5 %
Eosinophil %: 4.9 %
HCT: 31.2 % — ABNORMAL LOW (ref 35.0–47.0)
Lymphocyte #: 0.9 x10 3/mm — ABNORMAL LOW (ref 1.0–3.6)
Lymphocyte %: 23.9 %
Monocyte #: 0.4 x10 3/mm (ref 0.2–0.9)
Neutrophil %: 59.3 %
WBC: 3.7 x10 3/mm (ref 3.6–11.0)

## 2012-08-05 LAB — COMPREHENSIVE METABOLIC PANEL
Albumin: 3.8 g/dL (ref 3.4–5.0)
Alkaline Phosphatase: 126 U/L (ref 50–136)
BUN: 12 mg/dL (ref 7–18)
Bilirubin,Total: 0.1 mg/dL — ABNORMAL LOW (ref 0.2–1.0)
Calcium, Total: 8.9 mg/dL (ref 8.5–10.1)
Chloride: 102 mmol/L (ref 98–107)
Co2: 25 mmol/L (ref 21–32)
Creatinine: 1.07 mg/dL (ref 0.60–1.30)
EGFR (African American): >60
Osmolality: 276 (ref 275–301)
Potassium: 3.8 mmol/L (ref 3.5–5.1)
SGOT(AST): 15 U/L (ref 15–37)
Sodium: 138 mmol/L (ref 136–145)

## 2012-08-05 LAB — CBC CANCER CENTER
Basophil #: 0 x10 3/mm (ref 0.0–0.1)
Eosinophil %: 2.6 %
HCT: 31.8 % — ABNORMAL LOW (ref 35.0–47.0)
HGB: 10.9 g/dL — ABNORMAL LOW (ref 12.0–16.0)
Lymphocyte #: 0.5 x10 3/mm — ABNORMAL LOW (ref 1.0–3.6)
Lymphocyte %: 11.6 %
MCHC: 34.2 g/dL (ref 32.0–36.0)
Monocyte %: 6.1 %
Neutrophil %: 79 %
Platelet: 292 x10 3/mm (ref 150–440)
RDW: 14.2 % (ref 11.5–14.5)
WBC: 4.8 x10 3/mm (ref 3.6–11.0)

## 2012-08-12 LAB — CBC CANCER CENTER
Basophil #: 0 x10 3/mm (ref 0.0–0.1)
Basophil %: 0.7 %
Eosinophil #: 0.2 x10 3/mm (ref 0.0–0.7)
Eosinophil %: 3.8 %
HCT: 27.5 % — ABNORMAL LOW (ref 35.0–47.0)
HGB: 9.3 g/dL — ABNORMAL LOW (ref 12.0–16.0)
Lymphocyte #: 0.9 x10 3/mm — ABNORMAL LOW (ref 1.0–3.6)
Lymphocyte %: 20.6 %
Monocyte #: 0.2 x10 3/mm (ref 0.2–0.9)
Monocyte %: 5.5 %
Neutrophil #: 3 x10 3/mm (ref 1.4–6.5)
Platelet: 230 x10 3/mm (ref 150–440)
RBC: 3.07 10*6/uL — ABNORMAL LOW (ref 3.80–5.20)
RDW: 14.1 % (ref 11.5–14.5)

## 2012-08-12 LAB — COMPREHENSIVE METABOLIC PANEL
Albumin: 3.2 g/dL — ABNORMAL LOW (ref 3.4–5.0)
Alkaline Phosphatase: 91 U/L (ref 50–136)
Anion Gap: 9 (ref 7–16)
BUN: 17 mg/dL (ref 7–18)
Bilirubin,Total: 0.1 mg/dL — ABNORMAL LOW (ref 0.2–1.0)
Calcium, Total: 8 mg/dL — ABNORMAL LOW (ref 8.5–10.1)
Co2: 25 mmol/L (ref 21–32)
EGFR (African American): 58 — ABNORMAL LOW
EGFR (Non-African Amer.): 50 — ABNORMAL LOW
Glucose: 88 mg/dL (ref 65–99)
Osmolality: 279 (ref 275–301)
Potassium: 3.8 mmol/L (ref 3.5–5.1)
SGOT(AST): 11 U/L — ABNORMAL LOW (ref 15–37)
Sodium: 139 mmol/L (ref 136–145)

## 2012-08-23 ENCOUNTER — Ambulatory Visit: Payer: Self-pay | Admitting: Radiation Oncology

## 2012-08-23 ENCOUNTER — Ambulatory Visit: Payer: Self-pay | Admitting: Oncology

## 2013-04-12 IMAGING — CR DG CHEST 2V
1 series · 2 of 2 positions shown · non-contrast
Comparison: none

REASON FOR EXAM: RUQ pain
COMMENTS:   May transport without cardiac monitor

PROCEDURE:     DXR - DXR CHEST PA (OR AP) AND LATERAL  - December 12, 2010 [DATE]
RESULT:     Comparison: None

[Series 1: view not recorded · 0.17mm/px · 2 of 2 slices shown]
[im 1/2]
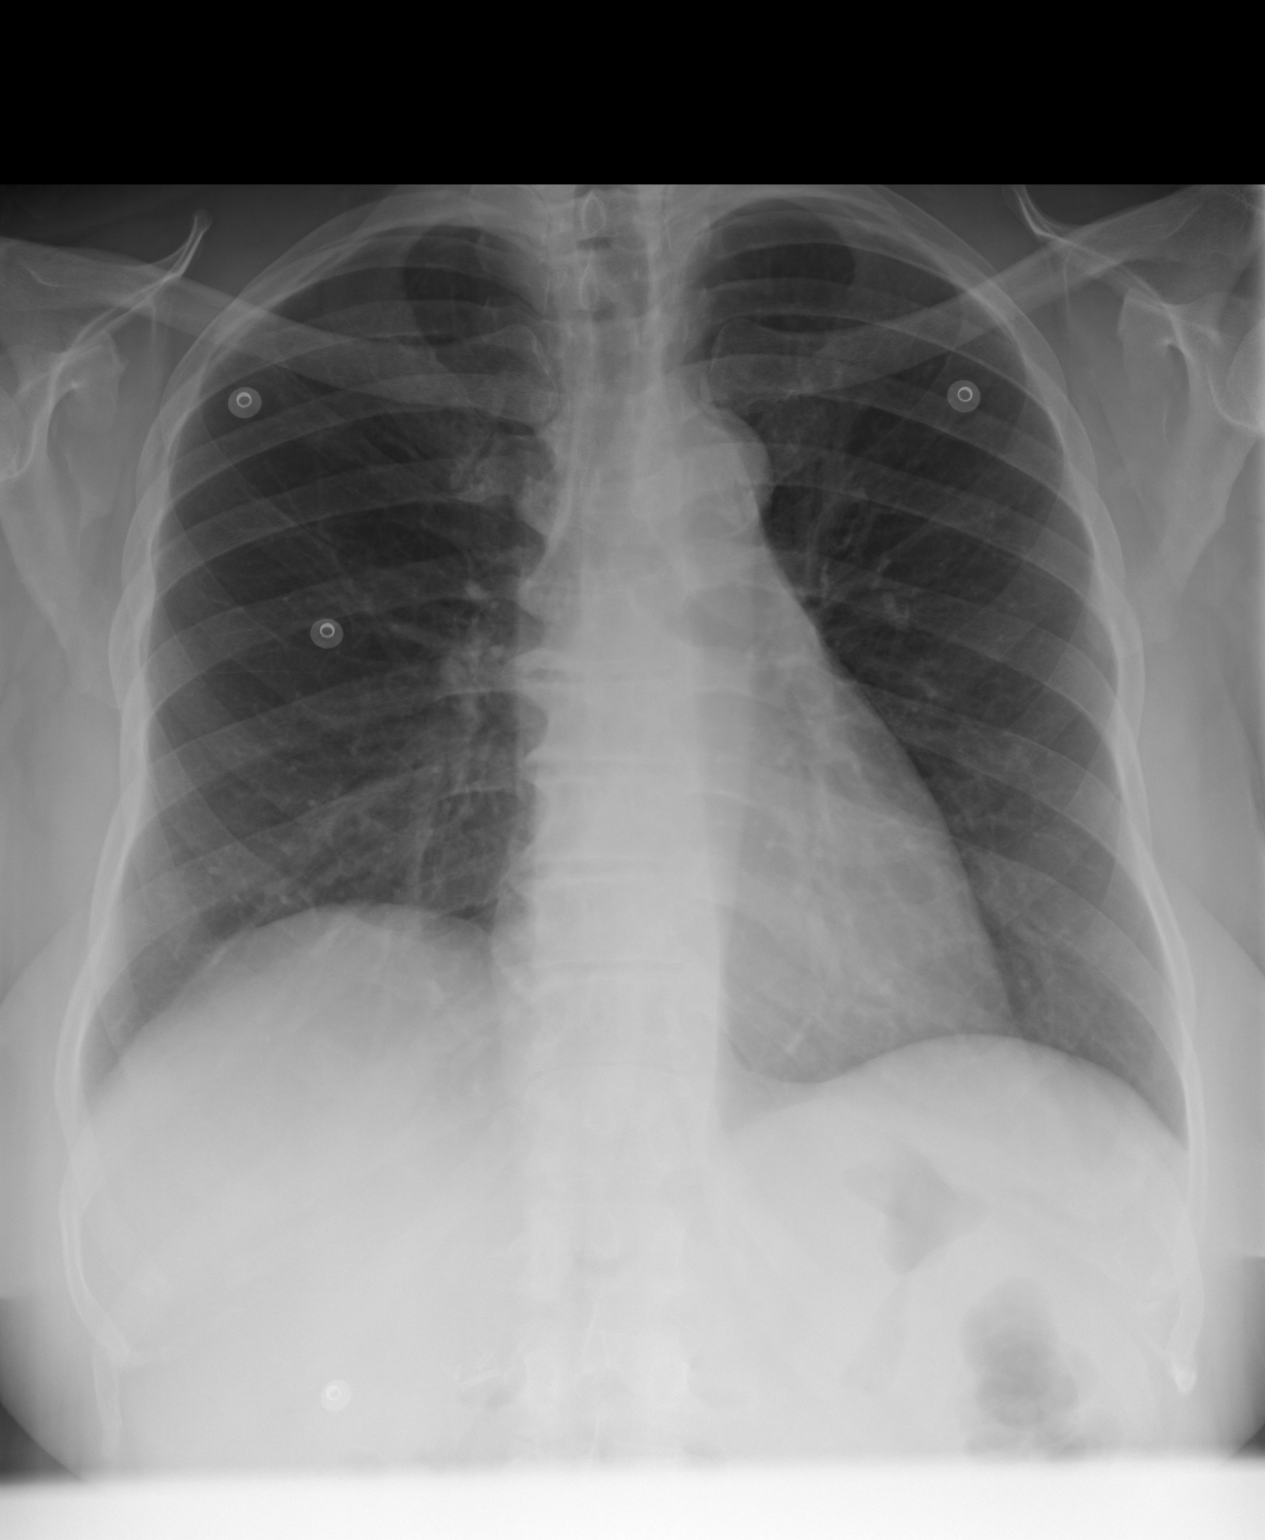
[im 2/2]
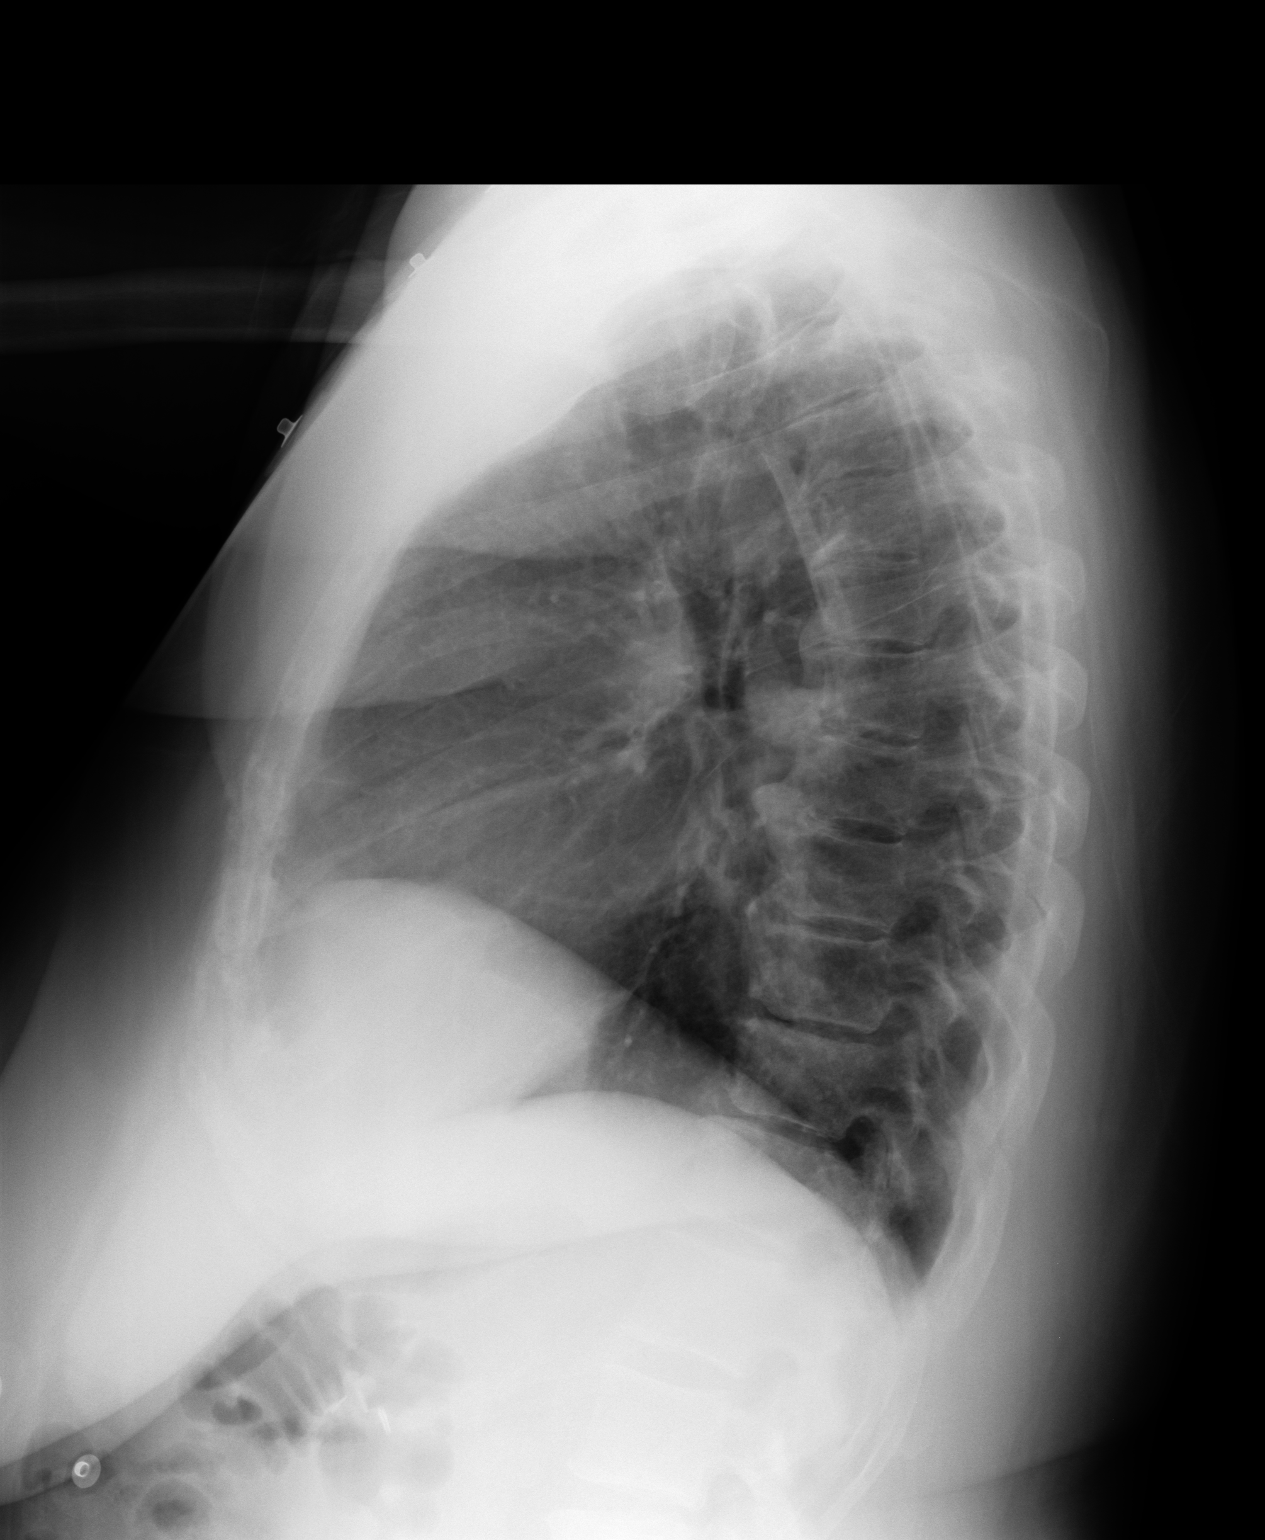

[2 of 2 positions shown; findings below may reference images not displayed]

FINDINGS: PA and lateral chest radiographs are provided.  There is no focal
parenchymal opacity, pleural effusion, or pneumothorax. The heart and
mediastinum are unremarkable.  The osseous structures are unremarkable.
IMPRESSION: No acute disease of the chest.

## 2013-05-25 DEATH — deceased

## 2014-01-08 IMAGING — US ABDOMEN ULTRASOUND
1 series · 17 of 25 positions shown · non-contrast
Comparison: none

REASON FOR EXAM: right abdomen and right flank pain
COMMENTS:

[Series 1: abdomen ultrasound · 17 of 90 slices shown]
[im 1/90]
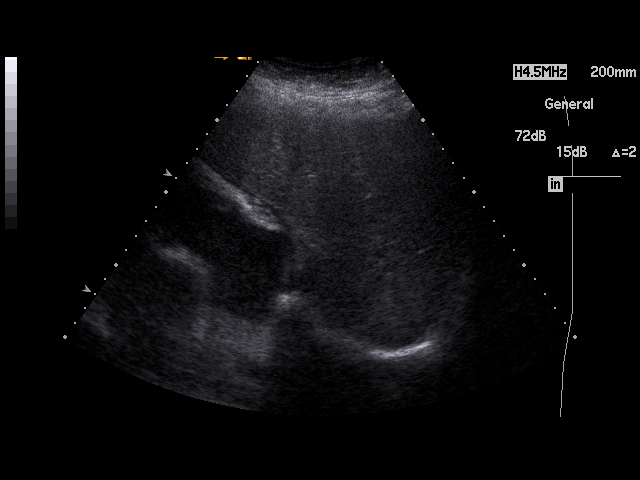
[im 8/90]
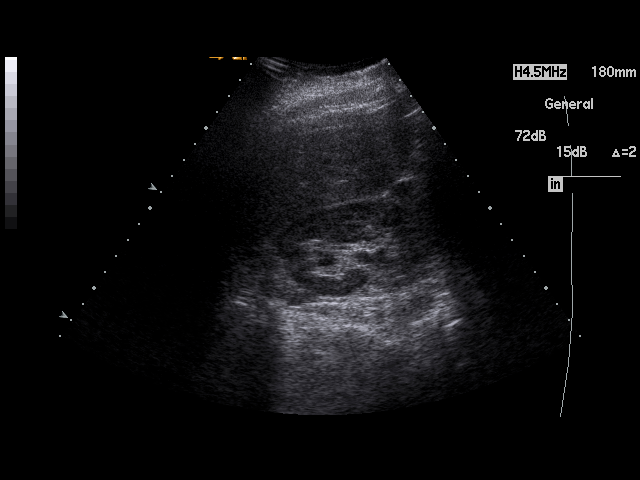
[im 12/90]
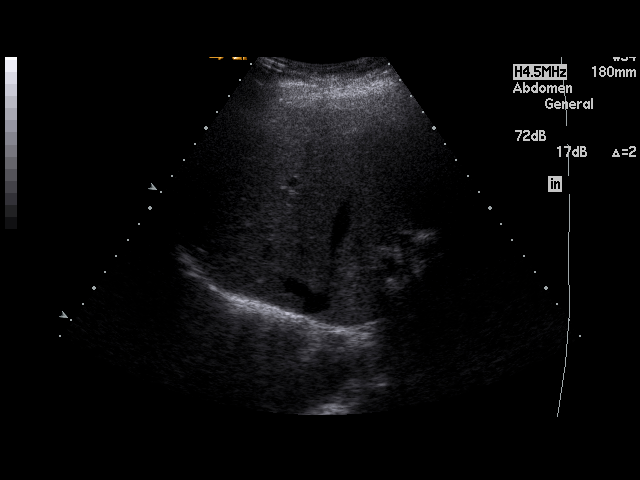
[im 19/90]
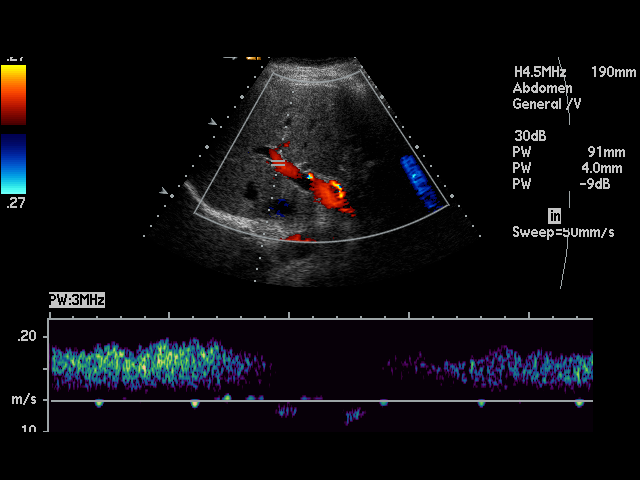
[im 23/90]
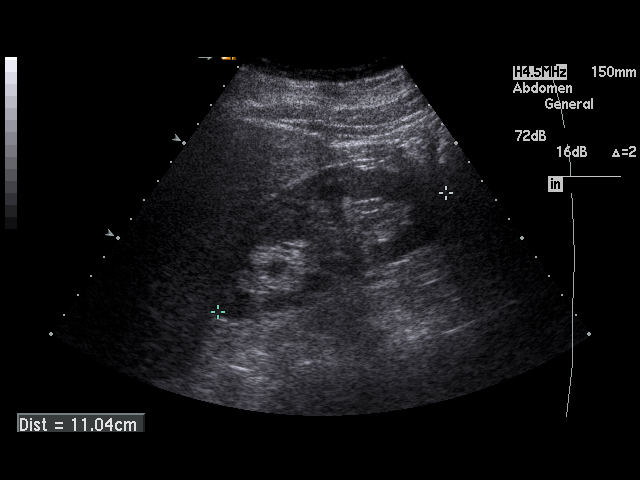
[im 30/90]
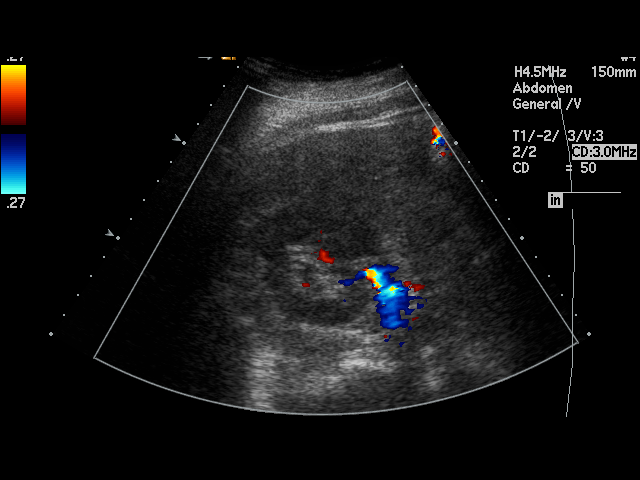
[im 34/90]
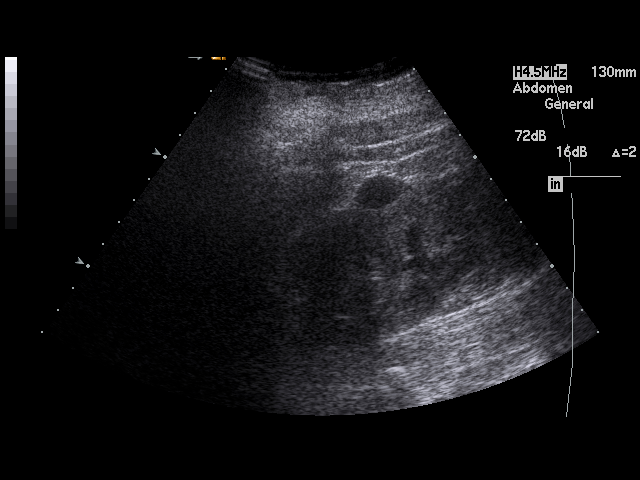
[im 41/90]
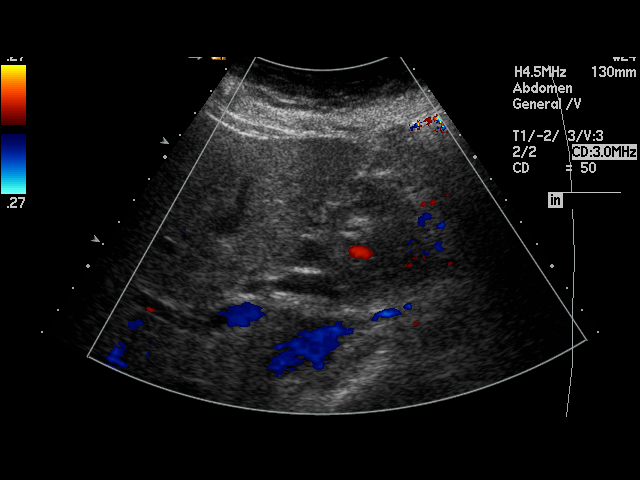
[im 45/90]
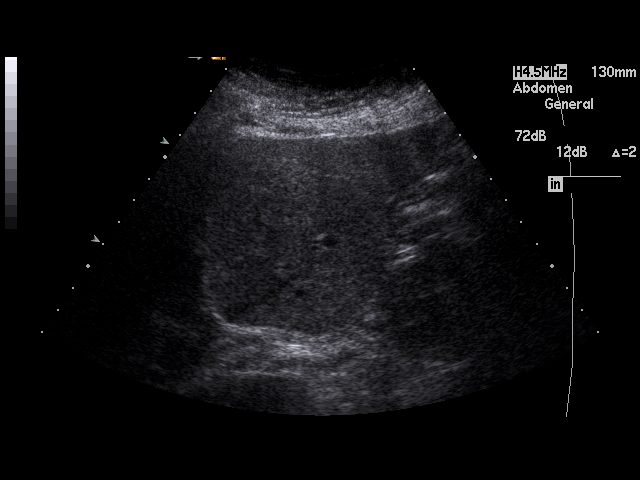
[im 49/90]
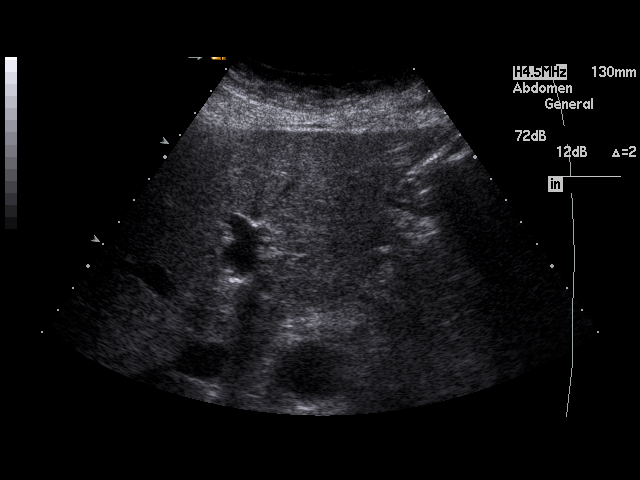
[im 56/90]
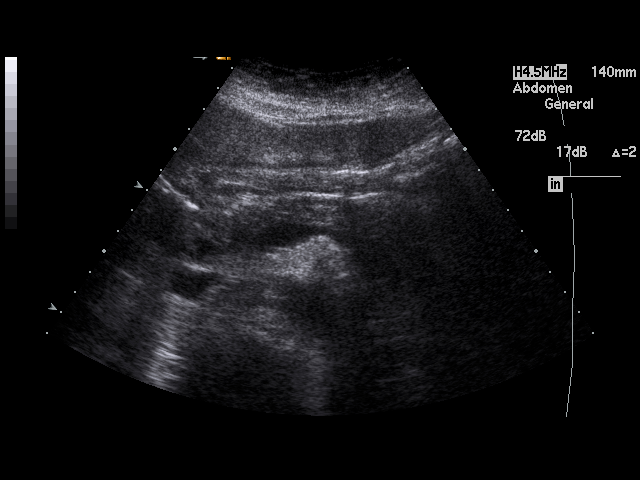
[im 60/90]
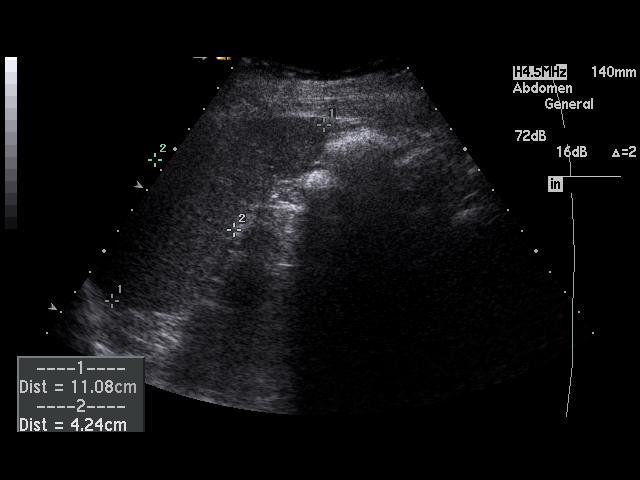
[im 67/90]
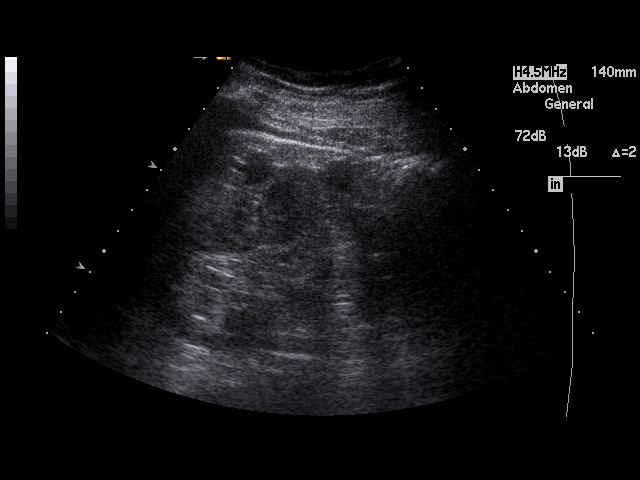
[im 71/90]
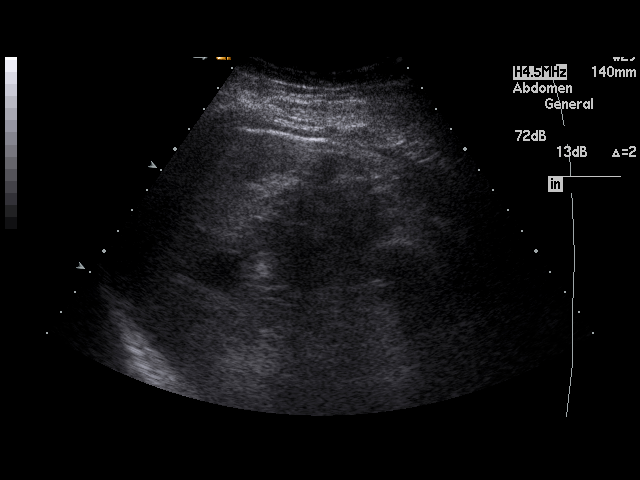
[im 78/90]
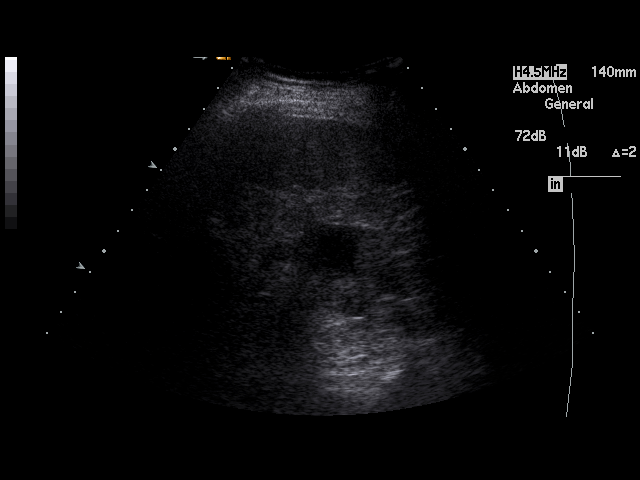
[im 82/90]
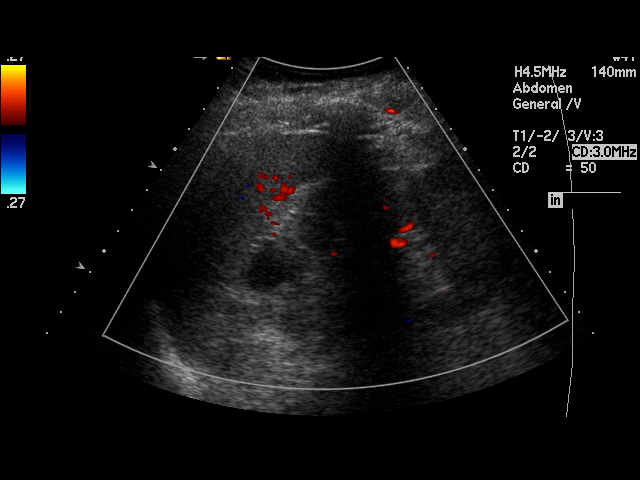
[im 90/90]
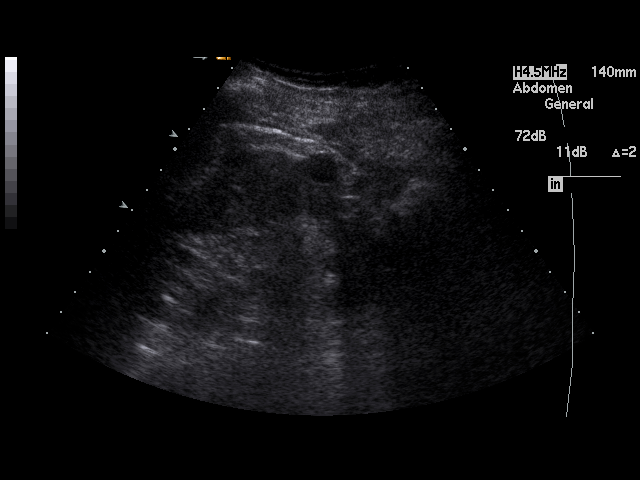

[17 of 25 positions shown; findings below may reference images not displayed]

PROCEDURE:     US  - US ABDOMEN GENERAL SURVEY  - September 09, 2011  [DATE]

RESULT:

The liver has a mildly nonhomogeneous appearance. This likely is of
technical origin but if the patient has abnormal liver function tests,
additional evaluation of the liver by hepatic CT or MR would be recommended.
There are no defined hepatic masses identified on this exam. No definite
fatty infiltration is seen. There are no dilated intrahepatic bile ducts.
Portal vein flow is hepatopetal. The pancreas, spleen and abdominal aorta
show no significant abnormalities. The gallbladder is not seen compatible
with prior cholecystectomy. The common bile duct measures 7.9 mm in diameter
which is prominent but which can be within the limits of normal for the post
cholecystectomy patient. The kidneys show no hydronephrosis. Sagittally, the
right kidney measures 11.04 cm and the left measures 11.59 cm. There are
multiple, small cysts of the left kidney with the largest measuring 2.26 cm
at maximum diameter. There is a 1.82 cm cyst in the midpole region of the
right kidney. No ascites is seen.
IMPRESSION: 1.  The hepatic echotexture appears heterogeneous. As noted above, this is
thought to likely be of technical origin; however, if liver function tests
are abnormal, then additional evaluation of the liver by CT or MR would be
recommended.
2.  The patient is status post cholecystectomy.
3.  The common bile duct is prominent which can be a normal finding in the
post cholecystectomy patient.
4.  No ascites is observed.
5.  Bilateral renal cysts are noted.

## 2014-06-24 IMAGING — NM NM  CARDIAC MUGA REST SCAN 2 0F 2
6 series · 38 of 38 positions shown · non-contrast
Comparison: none

REASON FOR EXAM: pre chemo  eval
COMMENTS:

[Series 1000: 70 degree-gated · 3.30mm/px · 6 of 24 frames shown]
[frame 3/24]
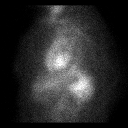
[frame 7/24]
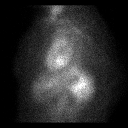
[frame 11/24]
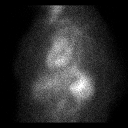
[frame 15/24]
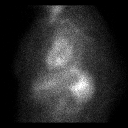
[frame 19/24]
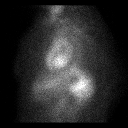
[frame 23/24]
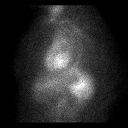

[Series 1000: 45 lao-gated (results) · 3.30mm/px · 6 of 24 frames shown]
[frame 3/24  full-range]
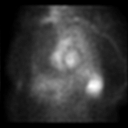
[frame 7/24  full-range]
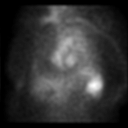
[frame 11/24  full-range]
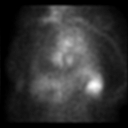
[frame 15/24  full-range]
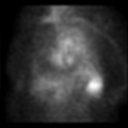
[frame 19/24  full-range]
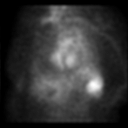
[frame 23/24  full-range]
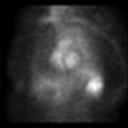

[Series 1000: anterior-gated · 3.30mm/px · 6 of 24 frames shown]
[frame 3/24]
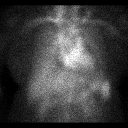
[frame 7/24]
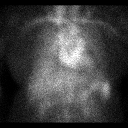
[frame 11/24]
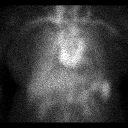
[frame 15/24]
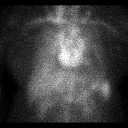
[frame 19/24]
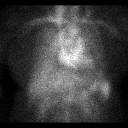
[frame 23/24]
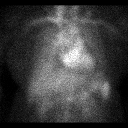

[Series 1000: 45 lao-gated (original with roi) · 3.30mm/px · 6 of 24 frames shown]
[frame 3/24]
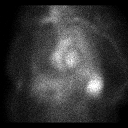
[frame 7/24]
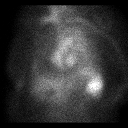
[frame 11/24]
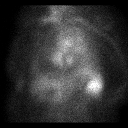
[frame 15/24]
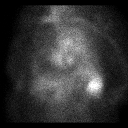
[frame 19/24]
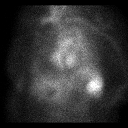
[frame 23/24]
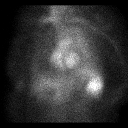

[Series 1000: 45 lao-gated (functional) · 3.30mm/px · 8 of 8 slices shown]
[im 1/8  full-range]
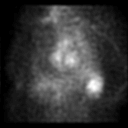
[im 2/8  full-range]
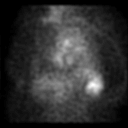
[im 3/8]
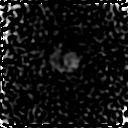
[im 4/8  full-range]
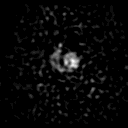
[im 5/8  full-range]
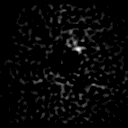
[im 6/8  full-range]
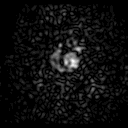
[im 7/8]
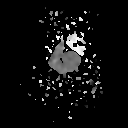
[im 8/8]
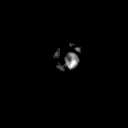

[Series 1000: 45 lao-gated · 3.30mm/px · 6 of 24 frames shown]
[frame 3/24]
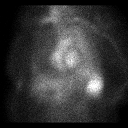
[frame 7/24]
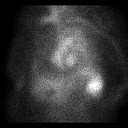
[frame 11/24]
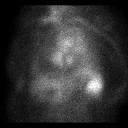
[frame 15/24]
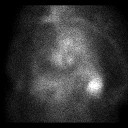
[frame 19/24]
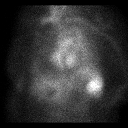
[frame 23/24]
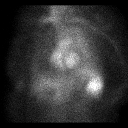

[38 of 38 positions shown; findings below may reference images not displayed]

PROCEDURE:     NM  - NM REST MUGA SCAN [DATE] OF [DATE]  - [DATE]  [DATE] [DATE] [DATE]

RESULT:     The patient's blood pool was labeled using 21.78 mCi of
technetium 99m labeled pertechnetate with 3 mm of pyrophosphate. Images were
then acquired in the LAO 45, LAO 70, and anterior projections.

There is concentric left ventricular wall motion. The left ventricular
ejection fraction is 88%.
IMPRESSION: Normal left ventricular ejection fraction of 88%.

[REDACTED]

## 2014-09-11 NOTE — Op Note (Signed)
PATIENT NAME:  Jessica MylarGIBSON, Lalani MR#:  [id number] DATE OF BIRTH:  25-Sep-1955  DATE OF PROCEDURE:  02/24/2012  PREOPERATIVE DIAGNOSIS: Breast carcinoma.   POSTOPERATIVE DIAGNOSIS: Breast carcinoma.   OPERATION: Port-A-Cath placement.   ANESTHESIA: Monitored anesthetic care.   SURGEON: Quentin Orealph L. Ely, III, MD    OPERATIVE PROCEDURE: With the patient in the supine position after appropriate padding, positioning, and intravenous sedation, the patient's left neck was prepped with ChloraPrep and draped with sterile towels. The internal jugular vein was interrogated on the left side. There did not appear to be any evidence of any stenosis. The vein appeared to be normal size. We chose the left side because the patient was going to undergo radiation on the right side. A small incision was made through an anesthetized area in the neck and the vein cannulated under direct vision using the ultrasound. However, wire could not be passed into the great vessel system. Multiple attempts were made under fluoroscopy without success. The wire kept bending and would not pass directly across the mediastinum into the great vessel system. That procedure was abandoned and the supraclavicular space was anesthetized. A small incision was made in that area and the subclavian vein was cannulated on a single pass. Again, the wire could not be passed under fluoroscopic control into the great vessel system despite multiple attempts. This area was abandoned and the incisions closed with 4-0 nylon. The patient was reprepped and draped on the right side. Again, ChloraPrep was utilized. The internal jugular vein was identified on the right side and appeared to be compressible and very dilated. A micropuncture technique was utilized and a wire passed into the great vessel system under direct vision with both fluoroscopy and ultrasound. The wire was then exchanged for an introducer and then exchanged to a larger wire. A placement site was  identified on the chest wall and this area was anesthetized. A subcutaneous suprafascial pocket was created for the Port-A-Cath device. Introducer and dilator were inserted over the wire into the great vessel system and the wire and dilator removed. Heparin filled catheter was inserted through the introducer into the great vessel system and positioned appropriately above the atrium using contrast. The catheter was then tunneled to the second incision where it was attached to the Port-A-Cath device. Contrast was used to confirm the absence of kinks or leaks and the appropriate position in the great vessel system. It was then flushed with heparinized saline. The device was sutured to the chest wall with two sutures of 3-0 silk. The subcutaneous space was obliterated with 3-0 Vicryl. Skin was closed with 4-0 nylon. Sterile dressings were applied. The patient was returned to the recovery room having tolerated the procedure well. Sponge, instrument, and needle counts were correct x2 in the operating room.    ____________________________ Quentin Orealph L. Ely III, MD rle:drc D: 02/24/2012 12:08:26 ET T: 02/24/2012 12:28:46 ET JOB#: [id number]  cc: Quentin Orealph L. Ely III, MD, <Dictator> Quentin OreALPH L ELY MD ELECTRONICALLY SIGNED 02/24/2012 15:11

## 2014-09-16 NOTE — H&P (Signed)
PATIENT NAME:  Jessica Vazquez, Jessica Vazquez MR#:  [id number] DATE OF BIRTH:  1955/06/06  DATE OF ADMISSION:  09/08/2011  CHIEF COMPLAINT AND IDENTIFYING DATA: Jessica Vazquez is a 59 year old female presenting to [facility] Emergency Department homeless and being off her medication for seven days. Jessica Vazquez had been voicing suicidal ideation with a plan to run in front of a car.   HISTORY OF PRESENT ILLNESS: Jessica Vazquez has been endorsing depressed mood and difficulty concentrating. Jessica Vazquez has been having command hallucinations. Her orientation and memory function are intact. Her thought process is coherent. Jessica Vazquez has the stress of being homeless. The duration of these symptoms is estimated at seven days.   PAST PSYCHIATRIC HISTORY: Jessica Vazquez does have a history of suicide attempt in the past and was admitted to a psychiatric unit after Jessica Vazquez cut her wrists. This was approximately 10 years ago. Jessica Vazquez has carried the diagnosis of schizoaffective disorder and has been followed by Dr. Mare FerrariLavine in the local community.   In reviewing the past medical record, in September 2011 Jessica Vazquez had bipolar disorder listed on her problem list. At that time Jessica Vazquez had medications including doxepin and Geodon.   The patient had also expressed a plan two days prior to admission of setting herself on the railroad tracks. Jessica Vazquez has been experiencing command hallucinations specifically to harm herself.   SOCIAL HISTORY: Jessica Vazquez has been residing in a shelter. Jessica Vazquez will need social work consult.   Jessica Vazquez has no legal charges or arrests in her history.   HOME MEDICATIONS:  1. Ambien 10 mg at bedtime p.r.n.  2. Xanax XR 0.5 mg q.a.m.  3. Trazodone 150 mg at bedtime.  However, the patient has not been taking these. It appears that there are some missing psychotropic agents in her history given her past diagnosis.   ALLERGIES: Jessica Vazquez has allergies to shellfish, penicillin G, aspirin.  MEDICAL PROBLEMS:  1. Hypertension. 2. Asthma.   Jessica Vazquez does have a  history of having a cholecystectomy and was complaining of abdominal pain. On the ultrasound of the abdomen there was an echotexture of a heterogenous nature thought to be technical in origin. However, if additional clinical tests indicated liver difficulty, additional imaging with CT or MR was recommended. The ultrasonographer was specifically mentioning liver function tests as an example of other liver abnormalities that would indicate the need for additional imaging. The SGPT is normal. SGOT is normal. BUN and creatinine unremarkable. TSH normal. Hemoglobin is decreased at 9.5, WBC 6.2, platelet count 285, amylase normal, lipase normal. Urine drug screen positive for cocaine. The urinalysis appeared to be an early catch with 13 epithelial cells and 4 WBCs. Of note, the hemoglobin on initial CBC was 10.0 and the second hemoglobin was 9.5.   REVIEW OF SYSTEMS: Constitutional, HEENT, mouth, neurologic, cardiovascular, respiratory, gastrointestinal, genitourinary, skin, musculoskeletal, hematologic, lymphatic, endocrine, metabolic all unremarkable except for that noted above.   PHYSICAL EXAMINATION:   VITAL SIGNS: Temperature 97, pulse 84, respiratory rate 18, blood pressure 140/89. The patient is examined with escort RN staff, Inetta Fermoina, present.   GENERAL APPEARANCE: Well developed, well nourished, middle-aged female sitting on her hospital bed with no abnormal involuntary movement. Jessica Vazquez has no cachexia. Her grooming and hygiene are normal.   MENTAL STATUS EXAM: Jessica Vazquez is alert. Her eye contact is intermittent. Concentration is mildly decreased. Jessica Vazquez is oriented to all spheres. Her memory, intelligence, and use of language are estimated to be within the normal range. Her speech involves a slightly flat prosody. There is  no dysarthria. Thought process is logical, coherent, and goal directed. There are no tangents or looseness of associations. Thought content as above. The patient does contract for safety in  the hospital. There are no thoughts of harming herself. No hallucinations or delusions.   Affect is anxious. Mood is depressed. Insight is fair. Judgment is impaired.   HEAD: Normocephalic, atraumatic. Pupils equally round and reactive to light and accommodation. Oropharynx clear without erythema.   NECK: Supple, nontender. No masses.   LUNGS: Clear to auscultation. No wheezing, rhonchi, or rales.   CARDIOVASCULAR: Regular rate and rhythm. No murmurs, rubs, or gallops.   ABDOMEN: Bowel sounds positive. There is general tenderness on the right side  with no rebound. There is no guarding or rigidity.   GENITOURINARY: Deferred.   NEUROLOGIC: Cranial nerves II through XII intact. General sensory intact throughout to light touch. Motor 5/5 throughout. Coordination intact by finger-to-nose bilaterally. Deep tendon reflexes normal strength and symmetry throughout. No Babinski.   ASSESSMENT:  AXIS I:  1. Schizoaffective disorder, depressed, with psychotic features.   2. Cocaine abuse.   AXIS II: Deferred.   AXIS III: History of cholecystectomy with current abdominal pain.   AXIS IV: Primary support group.   AXIS V: 30.   Jessica Vazquez would be at risk to harm herself outside of the supportive environment of a hospital.   PLAN:  1. Therefore, will admit Jessica Vazquez to the Inpatient Behavioral Health Unit.  2. Jessica Vazquez will undergo supportive milieu and group psychotherapy.    3. Jessica Vazquez will be restarted on her Geodon. This will be for anti-psychosis as well as mood stabilization. Jessica Vazquez will be placed on Celexa 10 mg q.a.m. and titrated to 20 mg q.a.m. as tolerated for anti-depression, antianxiety.    ____________________________ Adelene Amas. Almir Botts, MD jsw:drc D: 09/09/2011 13:02:26 ET T: 09/09/2011 13:17:29 ET JOB#: [id number]  cc: Adelene Amas. Christalyn Goertz, MD, <Dictator> Lester [place] MD ELECTRONICALLY SIGNED 09/09/2011 18:10

## 2014-09-16 NOTE — Discharge Summary (Signed)
PATIENT NAME:  Jessica Vazquez, Jessica Vazquez MR#:  [id number] DATE OF BIRTH:  1955-11-25  DATE OF ADMISSION:  09/08/2011 DATE OF DISCHARGE:  09/14/2011  HISTORY OF PRESENT ILLNESS: Jessica Vazquez is a 59 year old female admitted to the inpatient behavioral unit after being off her medication for seven days. She was having suicidal ideation with a plan to run in front of a car. She was endorsing depressed mood and difficulty concentrating as well as command hallucinations.   ANCILLARY CLINICAL DATA: Jessica Vazquez had severe abdominal pain and a history of a cholecystectomy. She had a slightly elevated lipase. [facility] consulted. She had a CT of the abdomen with contrast. The CT was nonsurgical.   She was also restarted on her amlodipine for hypertension. There was no additional treatment recommended for her abdominal pain. She was tolerating a diet well and had been placed on Prilosec.   HOSPITAL COURSE: Jessica Vazquez was admitted to the inpatient behavioral health unit and underwent milieu and group psychotherapy. This was disrupted some by her abdominal pain. Her psychotropic medication regimen included restarting her trazodone titrated to 150 mg at bedtime, Ambien 10 mg at bedtime p.r.n. insomnia, and Celexa titrated to 20 mg every a.m. for depression.   She was restarted on Geodon titrated to 40 mg twice a day with good results.  Jessica Vazquez was able to show an improvement in her mood as well as resolution of hallucinations. She was socially cooperative.   CONDITION ON DISCHARGE: By 09/14/2011 Jessica Vazquez has no hallucinations. Her mood is normal. Her interests have returned to normal. She has constructive future goals. She has no adverse medication effects.   MENTAL STATUS EXAM UPON DISCHARGE: Jessica Vazquez is alert. Her eye contact is good. Concentration is normal. She is oriented to all spheres. Memory is intact to immediate, recent, and remote. Fund of knowledge, intelligence, and use of language are normal. Speech is  normal without dysarthria. Thought process is logical, coherent, and goal directed. No looseness of associations. Thought content: No thoughts of harming herself. No thoughts of harming others. No delusions. No hallucinations. Affect is broad and appropriate. Mood within normal limits. Insight intact. Judgment intact.   DISCHARGE DIAGNOSES:   AXIS I:  1. Schizoaffective disorder with depressive psychotic features now stable and psychotic features resolved. 2. History of cocaine abuse.   AXIS II: Deferred.   AXIS III: History of cholecystectomy with idiopathic abdominal pain possibly due to gastroesophageal reflux, now on proton pump inhibitor.   AXIS IV: Primary support group, general medical.   AXIS V: 55.   Jessica Vazquez is not at risk to harm herself or others. She agrees to call emergency services immediately for any thoughts of harming herself, thoughts of harming others, or distress. Diet is regular. Activity is routine. Followup with her primary care physician for the general medical problems mentions above within 7 to 10 days, [facility] Dr. Suzie Portela on 10/06/2011 at 9:30 a.m., and [facility] on 09/17/2011 at 9:00 a.m.   DISCHARGE MEDICATIONS:  1. Xanax XR 0.5 mg 1 daily.  2. Trazodone 150 mg at bedtime. 3. Ambien 10 mg at bedtime p.r.n. insomnia. 4. Vistaril 25 mg four times daily p.r.n. itching. 5. Geodon 40 mg one twice a day. 6. Loratadine one twice a day p.r.n.  7. Amlodipine 5 mg daily. 8. Pantoprazole 40 mg twice a day.     9. Ferrous sulfate 325 mg daily. 10. Celexa 20 mg daily. ____________________________ Adelene Amas. Linn Goetze, MD jsw:slb D: 09/14/2011 18:44:28 ET T: 09/15/2011  10:38:14 ET JOB#: [id number]  cc: Adelene AmasJames S. Johsua Shevlin, MD, <Dictator> Lester CarolinaJAMES S Kaion [place] MD ELECTRONICALLY SIGNED 09/23/2011 1:46

## 2014-09-16 NOTE — Consult Note (Signed)
PATIENT NAME:  Jessica Vazquez, Jessica Vazquez MR#:  [id number] DATE OF BIRTH:  08-22-1955  DATE OF CONSULTATION:  09/09/2011  REFERRING PHYSICIAN: Antonietta Breach, MD CONSULTING PHYSICIAN:  Krystal Eaton, MD  REASON FOR CONSULTATION:  Abdominal pain.   HISTORY OF PRESENT ILLNESS: The patient is a 59 year old African American female with history of bipolar schizoaffective disorder, previous history of hypertension, not on any medications, asthma, tobacco abuse who came in yesterday and got admitted to Behavioral Medicine for suicidal ideation. She was also experiencing hallucinations. Medicine was consulted for evaluation for abdominal pain. The patient states that she has this left upper quadrant and epigastric abdominal pain for three months. The patient's pain comes off and on, but mostly is there. It gets worse with food. There is no nausea, vomiting, or diarrhea. The patient states sometimes it radiates towards the flank. She has history of pancreatitis in the past. When I went to Behavioral Medicine, she was sitting at the common hall. She appeared comfortable. She walked to her room. She did not appear to be in any significant pain. She stated that she was ready to go home. Of note, the patient has had some work-up here. On arrival, she had elevated creatinine of 1.35 which has trended down to 1.15 today. She also has urine toxicology positive for cocaine. Her LFTs yesterday and today appear to be pretty much normal.   PAST MEDICAL HISTORY:  1. Asthma. 2. Hypertension per chart which she is not on any medications for. 3. Tobacco use. 4. Schizoaffective and bipolar disorders.   SOCIAL HISTORY: Used to reside in a shelter, has been homeless currently. She smokes four cigarettes a day. Denies alcohol or drug use although she has positive cocaine in the urine toxicology.   PAST SURGICAL HISTORY:  1. C-section. 2. Cholecystectomy.   FAMILY HISTORY: Asthma in her mother.  HOME MEDICATIONS:  1. Ambien 10  mg at bedtime as needed. 2. Xanax extended-release 0.5 mg in the morning. 3. Trazodone 150 mg at bedtime.  4. She states she is on two inhalers, but she takes albuterol as needed.  REVIEW OF SYSTEMS: CONSTITUTIONAL: Denies fevers, occasional chills. No weight changes. EYES: Denies blurry vision or double vision. ENT: Denies hearing loss, postnasal drip. RESPIRATORY: Denies having any cough, shortness of breath, or wheezing. She does have a history of asthma. CARDIOVASCULAR: Denies any chest pain, orthopnea, palpitations, or syncope. GASTROINTESTINAL: She has abdominal pain that is chronic for three months. No vomiting, nausea, or diarrhea. No black tarry stools. GENITOURINARY: Denies dysuria or hematuria. Occasional increased frequency. ENDOCRINE: Denies polyuria, nocturia, or thyroid problems. HEME/LYMPH: Denies easy bleeding. She does have a history of anemia. SKIN: Denies any rashes. MUSCULOSKELETAL: No chronic arthritis. NEUROLOGIC: Denies any transient ischemic attack, cerebrovascular accident or weakness. PSYCHIATRIC: Positive for bipolar and schizoaffective, depression and anxiety.   PHYSICAL EXAMINATION:  VITAL SIGNS: Temperature 97, pulse 84, respiratory rate 18, blood pressure 140/89, yesterday was 140/98.   GENERAL: The patient appears to be a well developed [place] American female in no obvious distress, talking in full sentences, wanting to go home.   HEENT: Normocephalic, atraumatic. Pupils are equal and reactive. Anicteric sclerae. Moist mucous membranes.   NECK: Supple. No thyroid tenderness. No lymphadenopathy.   CARDIOVASCULAR: S1, S2. Regular rate and rhythm. No murmurs, rubs, or gallops.   LUNGS: Clear to auscultation. No wheezing, rebound or guarding.   ABDOMEN: Soft. There is a healed midline lower abdomen scar from previous surgery. No significant tenderness on the right upper or lower  quadrant. There is mild tenderness in the left upper quadrant and some on the  epigastrium. No rebound or guarding. Positive normoactive bowel sounds in all quadrants.   EXTREMITIES: No significant edema in the lower extremities.   NEUROLOGIC: Cranial nerves II through XII appears to be grossly intact. Strength 5/5 in all extremities. Sensation intact to light touch.   NEUROLOGIC: Appears to be awake, alert, and oriented x3.   LABORATORY, DIAGNOSTIC AND RADIOLOGICAL DATA: Sodium 139, potassium 4.4, chloride 104, CO2 25, BUN yesterday 21, today 19. Creatinine yesterday 1.35, today 1.15. LFTs: bilirubin 0.1, otherwise within normal limits. TSH is 0.823. Urinalysis yesterday showing 1+ leukocyte esterase, no nitrites, no bacteria. WBC 6.2, hemoglobin 9.5 today, hematocrit 29.2 and platelets 285. Lactic acid 1.9. Ultrasound of the abdomen showing hepatic echo texture appears heterogeneous, likely technical. The patient is status post cholecystectomy. Common bile duct is prominent, which can be normal finding in post cholecystectomy. No ascites. Biliary renal cysts are noted.   ASSESSMENT AND PLAN: We have a 59 year old African American female with bipolar, schizoaffective, depression and anxiety disorder, on no medications for a week as she states she ran out and previous history of hypertension, who is not on any blood pressure medications currently, [facility] with abdominal pain. The abdominal pain appears to be concentrated in the left upper quadrant and some in epigastric area. This does not appear to be surgical in nature. It is worse with food. It appears to be chronic for three months per the patient. She does have history of pancreatitis previously. She is not a drinker. I will check a lipase and CT of the abdomen at this point. She appears to be not in significant amount of pain. She is ambulating in the hall and wanting to go home. The description of the pain is suggestive of peptic ulcer disease. I will start the patient on a  PPI b.i.d. She is already on Maalox. LFTs are within normal limits. There is no right-sided pain or tenderness. I would again follow with the lipase and a CT of abdomen. The patient does have elevated blood pressure. She was on HCTZ started several years ago by [facility]; however, as an outpatient, she is not on any blood pressure medicine. I would start amlodipine here. The patient does smoke tobacco and she was counseled for greater than three minutes. In regards to her asthma, she has no wheezing. She is not short of breath. I would continue the albuterol as you are doing. In regards to her psych issues, there are being attended by psychiatry.   Thank you for the opportunity to see your patient. I will follow along with you.   The patient is a FULL CODE.    TOTAL TIME SPENT: 55 minutes    ____________________________ Krystal EatonShayiq Sederick Jacobsen, MD sa:ap D: 09/09/2011 18:50:25 ET T: 09/10/2011 10:24:25 ET JOB#: [id number]  cc: Krystal EatonShayiq Qais Jowers, MD, <Dictator>  Krystal EatonSHAYIQ Verlon Pischke MD ELECTRONICALLY SIGNED 09/11/2011 12:45

## 2014-09-16 NOTE — Consult Note (Signed)
PATIENT NAME:  Jessica Vazquez, Jessica Vazquez MR#:  [id number] DATE OF BIRTH:  1955/07/18  DATE OF CONSULTATION:  12/02/2011  REFERRING PHYSICIAN:   CONSULTING PHYSICIAN:  Quentin Orealph L. Ely III, MD  PRIMARY CARE PHYSICIAN: None.   ADMITTING PHYSICIAN: [facility]    CHIEF COMPLAINT: Right breast mass.   BRIEF HISTORY: Jessica Vazquez is a 59 year old woman with significant psychiatric history, chronic lung disease, and hypertension admitted to the hospital for further evaluation. She had some pain mostly in her breast and neck. She was admitted with concerns about exacerbation of her lung disease and probable breast lesion. On evaluation she was noted to have a 1 to 2 cm right breast mass in the lateral upper outer quadrant. Surgical service was consulted.   She has history of significant psychiatric disease treated by Dr. Jeanie SewerWilliford. She has history of suicidal ideation, cocaine abuse, tobacco abuse, chronic obstructive lung disease, hypertension, and possible bipolar disorder.   MEDICATIONS: Well outlined in the admission note.  Previous surgery includes a cholecystectomy and Cesarean section.   SOCIAL HISTORY: As outlined in her admission note. She has been homeless in the past, currently lives with her daughter at the present time. She is a longstanding cigarette smoker. Does have history of cocaine use in the past.   REVIEW OF SYSTEMS: Reviewed on the admission note and I agree with those findings.   PHYSICAL EXAMINATION:   GENERAL: The patient is an alert, pleasant woman having dinner, sitting comfortably in bed.   VITAL SIGNS: Blood pressure is stable at 148/89, heart rate 68 and regular, oxygen saturations not performed.   HEENT: Unremarkable. She has no scleral icterus and she has no pupillary abnormalities.   NECK: Supple without adenopathy. Has normal adenopathy. Trachea is midline.   CHEST: Clear with no adventitious sounds. She has normal pulmonary excursion.   CARDIAC: No murmurs or  gallops. She seems to be in normal sinus rhythm. She has normal PMI.   BREASTS: She does not have any lesions in the left breast. In the right breast she has a hard freely movable lesion in the upper outer quadrant. It is firm, nontender with no axillary adenopathy noted.   ABDOMEN: Benign.   EXTREMITIES: Lower extremities reveal some mild edema with no evidence of any significant decreased range of motion.   PSYCHIATRIC: Flat affect but no obvious anxiety and she appears to be normally oriented.   IMPRESSION: This woman has an obvious right breast mass. It is freely movable but is firm and could possibly be malignant. We would recommend imaging with ultrasound and then consideration for possible needle biopsy.   This plan has been discussed with the patient in detail. Will plan for an ultrasound in the morning and then make a decision with regard to inpatient biopsy versus office biopsy. She is in agreement with this plan.   ____________________________ Carmie Endalph L. Ely III, MD rle:drc D: 12/02/2011 17:36:58 ET T: 12/03/2011 09:05:22 ET JOB#: [id number]  cc: Quentin Orealph L. Ely III, MD, <Dictator> Quentin OreALPH L ELY MD ELECTRONICALLY SIGNED 12/08/2011 7:52

## 2014-09-16 NOTE — Consult Note (Signed)
PATIENT NAME:  Jessica MylarGIBSON, Maday MR#:  [id number] DATE OF BIRTH:  1956/03/09  DATE OF CONSULTATION:  12/02/2011  REFERRING PHYSICIAN:   Katharina Caperima Vaickute, MD CONSULTING PHYSICIAN:  Lamar BlinksBruce J. Dailin Sosnowski, MD  PRIMARY CARE PHYSICIAN:  None.  REASON FOR CONSULTATION: Chest discomfort, unstable angina, with aortic valve disease.   CHIEF COMPLAINT: "I had chest pain."   HISTORY OF PRESENT ILLNESS: This is a 59 year old female smoker who has had no evidence of hypertension or other cardiovascular issues having new onset of chest discomfort radiating into her back and neck with shortness of breath, weakness, and fatigue. She has had some progressive fatigue and shortness of breath with physical activity over the last several months consistent with possible cardiovascular issues. She has had an exam suggesting aortic valve stenosis as well. EKG currently shows normal sinus rhythm with an incomplete right bundle branch block. Troponin and CK-MB are within normal limits. She did have improvements with her symptoms with Percocet at this time and is stable.   REVIEW OF SYSTEMS: The remainder review of systems is negative for vision change, ringing in the ears, hearing loss, cough, congestion, heartburn, nausea, vomiting, diarrhea, bloody stools, stomach pain, extremity pain, leg weakness, cramping of the buttocks, known blood clots, headaches, blackouts, dizzy spells, nosebleeds, congestion, trouble swallowing, frequent urination, urination at night, muscle weakness, numbness, anxiety, depression, skin lesions, or skin rashes.   PAST MEDICAL HISTORY: None.   FAMILY HISTORY: No family members with early onset of cardiovascular disease or hypertension.   SOCIAL HISTORY: Smokes 1/2 pack per day and denies alcohol use, no caffeine use.   ALLERGIES: No known drug allergies.   CURRENT MEDICATIONS: As listed.   PHYSICAL EXAMINATION:  VITAL SIGNS: Blood pressure 126/68 bilaterally, heart rate 72 upright, reclining, and  regular.   GENERAL: She is a well appearing female in no acute distress.   HEENT: No icterus, thyromegaly, ulcers, hemorrhage, or xanthelasma.     CARDIOVASCULAR:  Regular rate and rhythm. Normal S1 and S2 with a 2 to 3/6 right upper sternal border murmur radiating throughout. PMI diffuse. Carotid upstroke normal without bruit. Jugular venous pressure is normal.   LUNGS: Lungs have few basilar crackles with normal respirations.   ABDOMEN: Soft, nontender, without hepatosplenomegaly or masses. Abdominal aorta is normal size without bruit.   EXTREMITIES: 2+ bilateral pulses in dorsal pedal, radial, and femoral arteries without lower extremity edema, cyanosis, clubbing, or ulcers.   NEUROLOGIC: She is oriented to time, place, and person with normal mood and affect.   ASSESSMENT: 59 year old female with chest pain, abnormal EKG, murmur undiagnosed without evidence of myocardial infarction needing further medical management and treatment options.   RECOMMENDATIONS:  1. Continue serial ECG and enzymes to assess for possible myocardial infarction. 2. Further Percocet for possible neck pain.  3. Echocardiogram for LV systolic dysfunction, valvular heart disease causing murmur undiagnosed. 4. Possible treadmill stress test as necessary for diagnostic treatment of the issues including possible true angina or unstable angina without infarct. 5. The patient was counseled on the discontinuation of tobacco abuse.       6. Further diagnostic testing and treatment options after above.   ____________________________ Lamar BlinksBruce J. Yamilette Garretson, MD bjk:bjt D: 12/02/2011 16:50:56 ET T: 12/02/2011 17:38:07 ET JOB#: [id number]  cc: Lamar BlinksBruce J. Camden Mazzaferro, MD, <Dictator> Lamar BlinksBRUCE J Carly Applegate MD ELECTRONICALLY SIGNED 12/14/2011 16:08

## 2014-09-16 NOTE — H&P (Signed)
PATIENT NAME:  Jessica Vazquez, MILIUS MR#:  [id number] DATE OF BIRTH:  11-02-1955  DATE OF ADMISSION:  12/01/2011  PRIMARY CARE PHYSICIAN: She doesn't have a primary care physician, however, she is [place]. She does not know who. In the past she was followed by Dr. Jeanie Sewer here in the hospital, however, Dr. Mare Ferrari was seeing her as outpatient.    HISTORY OF PRESENT ILLNESS: Patient is a 59 year old African American female with past medical history significant for history of admission to the hospital in April 2013 for suicidal ideation. At that time she was off her medications. Also history of chronic obstructive pulmonary disease, history of hypertension who presented to hospital with complaints of right-sided chest pains. Patient was doing well up until a few days ago, three days ago, when she started having right-sided chest pains. She stated that her pain mostly is in her breast area. Her breast was hurting and she started having also headache, also she has been having neck pain mostly on the left side of the neck as well as head pain. Because of the discomfort she decided to come to the Emergency Room for further evaluation. In the Emergency Room, she gave history of multiple medical problems, medical concerns and hospitalist services were contacted for admission.   PAST MEDICAL HISTORY:  1. History of suicidal ideation admission for the same and treated by Dr. Jeanie Sewer in April 2013.  2. History of chronic obstructive pulmonary disease. 3. Hypertension.  4. Abdominal pain status post cholecystectomy. 5. History of elevated lipase levels in April 2013.  6. History of asthma.  7. Chronic obstructive pulmonary disease.  8. Tobacco abuse. 9. Schizophrenia as well as bipolar disorder. According to Dr. 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 10. History of cocaine abuse. 11. Questionable gastroesophageal reflux disease.    MEDICATIONS:  1. Norvasc 5 mg p.o. daily. 2. Xanax XR 0.5 mg 3 times daily. 3. Trazodone 150 mg p.o. at bedtime. 4. Ambien 10 mg p.o. at bedtime as needed. 5. Vistaril 25 mg 4 times daily as needed.  6. Geodon 40 mg p.o. twice daily. 7. Iron sulfate 325 mg p.o. daily. 8. Celexa 20 mg p.o. daily.  9. Norvasc 5 mg p.o. daily.   PAST SURGICAL HISTORY:  1. Cesarean section. 2. Cholecystectomy.   FAMILY HISTORY: Asthma in patient's mother.   SOCIAL HISTORY: Used to reside in shelter, was homeless. Used to smoke four cigarettes a day. Denies alcohol or drug abuse, however, in the past tested positive for cocaine on urine toxicology. Claims that she lives with her daughter now and her two children. However, used to smoke much more. She has been smoking for the past 30 years. No alcohol abuse. She used to work as Lawyer.   REVIEW OF SYSTEMS: CONSTITUTIONAL: Positive for fatigue and weakness for the past few days, some pain in the chest, noted that she has lump in her right breast, weight loss, she is not sure how much she lost but she thinks that she lost some. She admits of having some chills as well as sweats for the past one week. Admits of having some blurring of vision for the past three days, some sinus congestion, itching all over her body. Urine poor odor as well as some dysuria symptoms, year-round allergies, postnasal drip, white sputum production with cough, some shortness of breath for the past one week, feeling presyncopal and syncopal, some constipation intermittently, having neck pains, head pains, left shoulder pains, right shoulder  pains, pains in right chest especially whenever she takes a deep breaths and whenever she walks, gastroesophageal reflux disease for which she uses Mylanta. Otherwise denies any fevers, weight gain. EYES: In regards to eyes denies any double vision, glaucoma, cataracts. ENT: Denies any tinnitus, allergies, epistaxis, sinus pain, dentures, difficulty  swallowing. RESPIRATORY: Denies any hemoptysis. Admits of painful respirations. CARDIOVASCULAR: Denies any chest pains, orthopnea, edema, arrhythmias, palpitations. Admits of syncope. GASTROINTESTINAL: Denies nausea, vomiting, diarrhea. Admits of constipation. GENITOURINARY: Denies any hematuria. Admits of increased frequency. Denies incontinence. ENDOCRINOLOGY: Denies polydipsia, nocturia, thyroid problems, heat or cold intolerance, or thirst. HEMATOLOGIC: Denies anemia, easy bruising, bleeding, swollen glands. SKIN: Denies any acne, lesions, change in moles. Admits of rash on her face as well as her back, feels like sand rash. NEUROLOGIC: No numbness, epilepsy or tremor, however, admits of having some left arm numbness intermittently for the past one month with neck pains as well as weakness in the left arm. NEUROLOGICAL: Denies anxiety, insomnia, or depression.   PHYSICAL EXAMINATION:  VITAL SIGNS: On arrival to the hospital patient's vital signs: Temperature 97.6, pulse 90, respiration rate 20, blood pressure 156/91, saturation 100% on room air.   GENERAL: This is well-nourished [place] American female in no significant distress, somewhat anxious, mildly uncomfortable lying on the stretcher.   HEENT: Her pupils are equal, reactive to light. Extraocular movements are intact. No icterus or conjunctivitis. Has normal hearing. She does have some swelling in periorbital area on the left but otherwise I do not see any significant asymmetry. She has normal hearing. No pharyngeal erythema. Mucosa is moist.   NECK: Neck did not reveal any masses, supple, nontender. Thyroid not enlarged. No adenopathy. No JVD or carotid bruits bilaterally. Full range of motion.   LUNGS: Clear to auscultation though slightly diminished breath sounds bilaterally. No rhonchi or wheezes. No rales. No labored respirations, increased effort, dullness to percussion, overt respiratory distress.   CARDIOVASCULAR: S1, S2 appreciated.  No murmurs, gallops, rubs noted. PMI not lateralized. Chest is nontender to palpation.   EXTREMITIES: 1+ pedal pulses. No lower extremity edema, calf tenderness, or cyanosis was noted.   ABDOMEN: Soft, nontender. Bowel sounds are present. No hepatosplenomegaly or masses were noted.   RECTAL: Deferred.   MUSCULOSKELETAL: Able to move all extremities. No cyanosis, degenerative joint disease, or kyphosis. Gait is not tested.   SKIN: Skin did not reveal any rashes, lesions, erythema, nodularity, induration. It was warm and dry to palpation. Patient does have nodular mass of approximately 1 inch in diameter in the lateral quadrant of her right breast which is tender to palpation. I am not able to palpate any lymph nodes in right axillary area. Skin otherwise was warm and dry to palpation and no abnormalities of the skin overlying the mass were noted. No adenopathy as mentioned above in the axillary area.  NEUROLOGICAL: Cranial nerves grossly intact. Sensory grossly intact. No dysarthria or aphasia. Patient is alert, oriented to time, person, place, cooperative. Memory is good.   PSYCHIATRIC: No significant confusion, agitation, depression.  LABORATORY, DIAGNOSTIC AND RADIOLOGICAL DATA: Patient's EKG showed normal sinus rhythm at 83 beats per minute, normal axis, possible left atrial enlargement, incomplete right bundle branch block; complete right bundle branch block seemed to be present already in June 2013. No acute ST-T changes were noted. BMP within normal limits, except estimated GFR slightly low at 59 for non-African American, however, for African American would be normal, more than 60. Liver enzymes were normal. Cardiac enzymes, first set negative.  CBC: White blood cell count 6.6, hemoglobin 10.9, platelet count 288. D-dimer is elevated at 0.54. Urinalysis yellow clear urine, negative for glucose, bilirubin or ketones, specific gravity 1.013, pH 7.0, negative for blood, protein, nitrites or  leukocyte esterase, less than 1 red blood cells, 2 white blood cells, no bacteria, 2 epithelial cells, mucous is present. Chest PA and lateral 12/01/2011 showed no findings suggesting congestive heart failure or pneumonia. CT scan of head without contrast 12/01/2011: White matter changes noted consistent with chronic ischemia. Other etiologies of white matter disease cannot be excluded according to radiologist. Doppler ultrasound of bilateral lower extremities 12/01/2011 is negative.    ASSESSMENT AND PLAN:  1. Chest pain, pleuritic chest pain, of unclear etiology. Admit patient to medical floor. Because of patient's new diagnosis of breast mass we cannot rule out pulmonary embolism so will admit patient for heparin infusion. Will start heparin IV. Will also get a CT scan in the morning. Unable to get CT scan now due to no IV dye.  2. Left neck pain, also left shoulder pain, questionable anginal equivalent. Get echocardiogram as well as cardiology consultation. Will start patient on metoprolol. Will continue heparin for now. Patient is allergic to aspirin therapy.  3. Intermittent left arm numbness as well as weakness, questionable degenerative disk disease related. Will get echocardiogram. Will get also carotid ultrasound. Patient may need to get Dr. Rosita KeaMenz evaluation spinal evaluation. I am not completely convinced if this is recurrent transient ischemic attacks but patient is on heparin drip for now and will re-evaluate.  4. Breast mass. Will get surgery involved.   TIME SPENT: One hour.    ____________________________ Katharina Caperima Dione Mccombie, MD rv:cms D: 12/01/2011 22:24:18 ET T: 12/02/2011 06:38:15 ET JOB#: [id number]  cc: Katharina Caperima Joedy Eickhoff, MD, <Dictator> Annessa Satre MD ELECTRONICALLY SIGNED 12/13/2011 18:38

## 2014-09-16 NOTE — Discharge Summary (Signed)
PATIENT NAME:  Jessica Vazquez, Jessica Vazquez MR#:  [id number] DATE OF BIRTH:  12-28-1955  DATE OF ADMISSION:  12/01/2011 DATE OF DISCHARGE:  12/04/2011  ADMITTING DIAGNOSES:  1. Right-sided chest pain.  2. Neck pain and left arm weakness.   DISCHARGE DIAGNOSES:  1. Right-sided chest pain likely due to right-sided breast mass. Negative V/Q scan. Negative cardiac enzymes.  2. Right-sided breast mass with spiculation concerning for breast cancer with a 2.4 x 2 x 1.8-cm hypoechoic mass seen by surgery, Jessica Vazquez.  She  will need outpatient followup.  3. Left upper extremity weakness, pain in the neck due to multi facet cervical spine disease with disk protrusion at C4-C5, bulge at C5-C6, as well as C6-C7 large paracentral annular bulge with prominent narrowing.   4. History of suicidal ideation.  5. Chronic obstructive pulmonary disease.  6. Hypertension.  7. Abdominal pains status post cholecystectomy.  8. History of elevated lipase levels in April 2013.  9. Asthma.  10. Chronic obstructive pulmonary disease.  11. Tobacco abuse.  12. Schizophrenia as well as bipolar disorder.  13. History of cocaine abuse.  14. Questionable history of gastroesophageal reflux disease.   PERTINENT LABORATORY AND EVALUATIONS: EKG showed normal sinus rhythm, possible left atrial enlargement, incomplete right bundle branch block, complete right bundle branch block. No ST changes. BMP showed normal BUN and creatinine. LFTs were normal. Urinalysis: Nitrites negative. WBC 6.6, hemoglobin 10.9, platelet count 288.  EKG showed normal sinus rhythm with possible left atrial enlargement, incomplete right bundle branch block. Ultrasound of the lower extremities showed no evidence of deep vein thrombosis. CT scan of the head showed white matter changes noted consistent with chronic ischemia. Mammogram showed spiculated mass in the upper outer right breast 2.4 x 2 x 1.8-cm hypoechoic mass with spiculated margins. Ultrasound of the breast  showed again spiculated mass in the upper outer right breast concerning for breast cancer. MRI of the cervical spine showed bulging and narrowing in the thecal sac. Please refer to above for details.   CONSULTANTS:  1. Jessica Vazquez.  2. Jessica Vazquez.   HOSPITAL COURSE: Please refer to History and Physical done by the admitting physician. The patient is 59 year old African American female with multiple medical issues who presented having right-sided chest pain. The patient also reports that she noticed a mass in her breast. The patient underwent a V/Q scan which was low probability. The patient's cardiac enzymes were negative. It was felt that her symptoms were likely due to the breast mass. She underwent ultrasound and a mammogram, which confirmed the findings of likely breast cancer. She was seen by Jessica Vazquez who initially stated that he would consider inpatient versus outpatient biopsy. Today his partner is covering, Jessica Vazquez, who recommends outpatient followup next week with Jessica Vazquez for tissue diagnosis. The patient was also complaining of left arm weakness with pain in her neck. She underwent an MRI, which confirmed multi disc disease with disk bulging, likely causing her symptoms. She will be referred to outpatient physical therapy.  She will eventually need neurosurgery evaluation for possible intervention if it does not improve with physical therapy. She first needs to have her breast issue resolved.   DISCHARGE MEDICATIONS:  1. Xanax 0.5/1 tab p.o. daily.  2. Trazodone 150 daily.  3. Geodon 40 mg 1 tab p.o. b.i.d.  4. Loratadine 10 mg q.12.  5. Hydroxyzine 25 mg 4 times per day as needed.  6. Amlodipine 5 daily.  7. Protonix 40, 1 tab p.o. b.i.d.  8.  Iron sulfate 325, 1 tab p.o. daily with meals.  9. Citalopram 20 daily.  10. Ambien 10 p.o. at bedtime.  11. The patient also asked to take Tylenol 650  every four hours p.r.n. for pain. 12. Motrin as needed, 200 mg 1 to 2 tabs every six hours  p.r.n. for pain.   DISCHARGE ACTIVITY: As tolerated.   REFERRAL: Physical therapy for left upper extremity weakness, C-spine disease causing the symptoms.   FOLLOWUP:  1. Follow up with Jessica Vazquez next week for breast mass.  2. [facility]- She has been scheduled an appointment next month.   NOTE: The patient is told she must go to see Jessica Vazquez, which is very important. Also referred to neurosurgery clinic for evaluation for disc bulge and left arm weakness.   TIME SPENT: 35 minutes.   ____________________________ Lacie ScottsShreyang H. Allena KatzPatel, MD shp:bjt D: 12/04/2011 10:21:36 ET T: 12/04/2011 11:32:46 ET JOB#: [id number]  cc: Jessica Vazquez H. Allena KatzPatel, MD, <Dictator> [facility] Jessica CarwinSHREYANG H Fatma Rutten MD ELECTRONICALLY SIGNED 12/04/2011 17:53
